# Patient Record
Sex: Male | Born: 1950 | Race: White | Hispanic: No | Marital: Married
Health system: Southern US, Community
[De-identification: ages and names within clinical notes are randomized; demographics above are authoritative.]

## PROBLEM LIST (undated history)

## (undated) DIAGNOSIS — J9811 Atelectasis: Secondary | ICD-10-CM

## (undated) DIAGNOSIS — A419 Sepsis, unspecified organism: Secondary | ICD-10-CM

## (undated) DIAGNOSIS — F1019 Alcohol abuse with unspecified alcohol-induced disorder: Secondary | ICD-10-CM

## (undated) DIAGNOSIS — I482 Chronic atrial fibrillation, unspecified: Secondary | ICD-10-CM

## (undated) DIAGNOSIS — J95811 Postprocedural pneumothorax: Secondary | ICD-10-CM

## (undated) DIAGNOSIS — I5022 Chronic systolic (congestive) heart failure: Secondary | ICD-10-CM

## (undated) DIAGNOSIS — R6520 Severe sepsis without septic shock: Secondary | ICD-10-CM

## (undated) DIAGNOSIS — J9621 Acute and chronic respiratory failure with hypoxia: Secondary | ICD-10-CM

---

## 2018-12-15 ENCOUNTER — Inpatient Hospital Stay
Admission: AD | Admit: 2018-12-15 | Discharge: 2019-01-19 | Disposition: A | Discharge status: Skilled Nursing Facility | Payer: PRIVATE HEALTH INSURANCE | Source: Other Acute Inpatient Hospital | Attending: Internal Medicine | Admitting: Internal Medicine

## 2018-12-15 DIAGNOSIS — Z9889 Other specified postprocedural states: Secondary | ICD-10-CM

## 2018-12-15 DIAGNOSIS — J95811 Postprocedural pneumothorax: Secondary | ICD-10-CM | POA: Diagnosis present

## 2018-12-15 DIAGNOSIS — J930 Spontaneous tension pneumothorax: Secondary | ICD-10-CM

## 2018-12-15 DIAGNOSIS — I2699 Other pulmonary embolism without acute cor pulmonale: Secondary | ICD-10-CM

## 2018-12-15 DIAGNOSIS — K567 Ileus, unspecified: Secondary | ICD-10-CM

## 2018-12-15 DIAGNOSIS — J9690 Respiratory failure, unspecified, unspecified whether with hypoxia or hypercapnia: Secondary | ICD-10-CM

## 2018-12-15 DIAGNOSIS — Z4659 Encounter for fitting and adjustment of other gastrointestinal appliance and device: Secondary | ICD-10-CM

## 2018-12-15 DIAGNOSIS — J939 Pneumothorax, unspecified: Secondary | ICD-10-CM

## 2018-12-15 DIAGNOSIS — Z4682 Encounter for fitting and adjustment of non-vascular catheter: Secondary | ICD-10-CM

## 2018-12-15 DIAGNOSIS — Z9689 Presence of other specified functional implants: Secondary | ICD-10-CM

## 2018-12-15 DIAGNOSIS — J9811 Atelectasis: Secondary | ICD-10-CM | POA: Diagnosis present

## 2018-12-15 DIAGNOSIS — F1019 Alcohol abuse with unspecified alcohol-induced disorder: Secondary | ICD-10-CM | POA: Diagnosis present

## 2018-12-15 DIAGNOSIS — J90 Pleural effusion, not elsewhere classified: Secondary | ICD-10-CM

## 2018-12-15 DIAGNOSIS — J9621 Acute and chronic respiratory failure with hypoxia: Secondary | ICD-10-CM | POA: Diagnosis present

## 2018-12-15 DIAGNOSIS — R6889 Other general symptoms and signs: Secondary | ICD-10-CM

## 2018-12-15 DIAGNOSIS — A419 Sepsis, unspecified organism: Secondary | ICD-10-CM | POA: Diagnosis present

## 2018-12-15 DIAGNOSIS — R6520 Severe sepsis without septic shock: Secondary | ICD-10-CM

## 2018-12-15 DIAGNOSIS — R0902 Hypoxemia: Secondary | ICD-10-CM

## 2018-12-15 DIAGNOSIS — I5022 Chronic systolic (congestive) heart failure: Secondary | ICD-10-CM | POA: Diagnosis present

## 2018-12-15 DIAGNOSIS — I482 Chronic atrial fibrillation, unspecified: Secondary | ICD-10-CM | POA: Diagnosis present

## 2018-12-15 HISTORY — DX: Severe sepsis without septic shock: R65.20

## 2018-12-15 HISTORY — DX: Atelectasis: J98.11

## 2018-12-15 HISTORY — DX: Alcohol abuse with unspecified alcohol-induced disorder: F10.19

## 2018-12-15 HISTORY — DX: Chronic systolic (congestive) heart failure: I50.22

## 2018-12-15 HISTORY — DX: Acute and chronic respiratory failure with hypoxia: J96.21

## 2018-12-15 HISTORY — DX: Sepsis, unspecified organism: A41.9

## 2018-12-15 HISTORY — DX: Chronic atrial fibrillation, unspecified: I48.20

## 2018-12-15 HISTORY — DX: Postprocedural pneumothorax: J95.811

## 2018-12-16 ENCOUNTER — Other Ambulatory Visit (HOSPITAL_COMMUNITY): Payer: Self-pay

## 2018-12-16 LAB — CBC WITH DIFFERENTIAL/PLATELET
Abs Immature Granulocytes: 0.08 10*3/uL — ABNORMAL HIGH (ref 0.00–0.07)
Basophils Absolute: 0 10*3/uL (ref 0.0–0.1)
Basophils Relative: 0 %
Eosinophils Absolute: 0.1 10*3/uL (ref 0.0–0.5)
Eosinophils Relative: 1 %
HCT: 30.3 % — ABNORMAL LOW (ref 39.0–52.0)
Hemoglobin: 9.3 g/dL — ABNORMAL LOW (ref 13.0–17.0)
Immature Granulocytes: 1 %
Lymphocytes Relative: 19 %
Lymphs Abs: 1.8 10*3/uL (ref 0.7–4.0)
MCH: 29.5 pg (ref 26.0–34.0)
MCHC: 30.7 g/dL (ref 30.0–36.0)
MCV: 96.2 fL (ref 80.0–100.0)
Monocytes Absolute: 0.8 10*3/uL (ref 0.1–1.0)
Monocytes Relative: 8 %
Neutro Abs: 6.7 10*3/uL (ref 1.7–7.7)
Neutrophils Relative %: 71 %
Platelets: 268 10*3/uL (ref 150–400)
RBC: 3.15 MIL/uL — ABNORMAL LOW (ref 4.22–5.81)
RDW: 17.2 % — ABNORMAL HIGH (ref 11.5–15.5)
WBC: 9.5 10*3/uL (ref 4.0–10.5)
nRBC: 0 % (ref 0.0–0.2)

## 2018-12-16 LAB — BASIC METABOLIC PANEL
Anion gap: 10 (ref 5–15)
BUN: 15 mg/dL (ref 8–23)
CO2: 25 mmol/L (ref 22–32)
Calcium: 8 mg/dL — ABNORMAL LOW (ref 8.9–10.3)
Chloride: 102 mmol/L (ref 98–111)
Creatinine, Ser: 0.8 mg/dL (ref 0.61–1.24)
GFR calc Af Amer: >60 mL/min (ref >60)
GFR calc non Af Amer: >60 mL/min (ref >60)
Glucose, Bld: 74 mg/dL (ref 70–99)
Potassium: 4.4 mmol/L (ref 3.5–5.1)
Sodium: 137 mmol/L (ref 135–145)

## 2018-12-16 LAB — APTT: aPTT: 146 seconds — ABNORMAL HIGH (ref 24–36)

## 2018-12-16 LAB — PROTIME-INR
INR: 1.24
Prothrombin Time: 15.5 seconds — ABNORMAL HIGH (ref 11.4–15.2)

## 2018-12-16 LAB — HEPARIN LEVEL (UNFRACTIONATED)
Heparin Unfractionated: 0.41 IU/mL (ref 0.30–0.70)
Heparin Unfractionated: 0.66 IU/mL (ref 0.30–0.70)
Heparin Unfractionated: <0.1 IU/mL — ABNORMAL LOW (ref 0.30–0.70)

## 2018-12-17 ENCOUNTER — Encounter: Payer: Self-pay | Admitting: Internal Medicine

## 2018-12-17 DIAGNOSIS — J9811 Atelectasis: Secondary | ICD-10-CM | POA: Diagnosis not present

## 2018-12-17 DIAGNOSIS — I482 Chronic atrial fibrillation, unspecified: Secondary | ICD-10-CM | POA: Diagnosis not present

## 2018-12-17 DIAGNOSIS — J9621 Acute and chronic respiratory failure with hypoxia: Secondary | ICD-10-CM

## 2018-12-17 DIAGNOSIS — R6520 Severe sepsis without septic shock: Secondary | ICD-10-CM

## 2018-12-17 DIAGNOSIS — I5022 Chronic systolic (congestive) heart failure: Secondary | ICD-10-CM | POA: Diagnosis present

## 2018-12-17 DIAGNOSIS — F1019 Alcohol abuse with unspecified alcohol-induced disorder: Secondary | ICD-10-CM | POA: Diagnosis present

## 2018-12-17 DIAGNOSIS — A419 Sepsis, unspecified organism: Secondary | ICD-10-CM

## 2018-12-17 LAB — COMPREHENSIVE METABOLIC PANEL
ALT: 20 U/L (ref 0–44)
AST: 13 U/L — ABNORMAL LOW (ref 15–41)
Albumin: 1.9 g/dL — ABNORMAL LOW (ref 3.5–5.0)
Alkaline Phosphatase: 50 U/L (ref 38–126)
Anion gap: 8 (ref 5–15)
BUN: 14 mg/dL (ref 8–23)
CHLORIDE: 105 mmol/L (ref 98–111)
CO2: 26 mmol/L (ref 22–32)
Calcium: 8 mg/dL — ABNORMAL LOW (ref 8.9–10.3)
Creatinine, Ser: 0.87 mg/dL (ref 0.61–1.24)
GFR calc non Af Amer: >60 mL/min (ref >60)
Glucose, Bld: 79 mg/dL (ref 70–99)
Potassium: 3.6 mmol/L (ref 3.5–5.1)
Sodium: 139 mmol/L (ref 135–145)
Total Bilirubin: 0.9 mg/dL (ref 0.3–1.2)
Total Protein: 4.5 g/dL — ABNORMAL LOW (ref 6.5–8.1)

## 2018-12-17 LAB — CBC
HCT: 32.9 % — ABNORMAL LOW (ref 39.0–52.0)
Hemoglobin: 10.2 g/dL — ABNORMAL LOW (ref 13.0–17.0)
MCH: 30.3 pg (ref 26.0–34.0)
MCHC: 31 g/dL (ref 30.0–36.0)
MCV: 97.6 fL (ref 80.0–100.0)
PLATELETS: 255 10*3/uL (ref 150–400)
RBC: 3.37 MIL/uL — ABNORMAL LOW (ref 4.22–5.81)
RDW: 17.4 % — ABNORMAL HIGH (ref 11.5–15.5)
WBC: 10.2 10*3/uL (ref 4.0–10.5)
nRBC: 0 % (ref 0.0–0.2)

## 2018-12-17 LAB — HEPARIN LEVEL (UNFRACTIONATED)
Heparin Unfractionated: <0.1 IU/mL — ABNORMAL LOW (ref 0.30–0.70)
Heparin Unfractionated: 0.73 IU/mL — ABNORMAL HIGH (ref 0.30–0.70)
Heparin Unfractionated: 0.51 IU/mL (ref 0.30–0.70)
Heparin Unfractionated: 0.45 IU/mL (ref 0.30–0.70)

## 2018-12-17 LAB — PHOSPHORUS: Phosphorus: 3.6 mg/dL (ref 2.5–4.6)

## 2018-12-17 LAB — MAGNESIUM: Magnesium: 1.7 mg/dL (ref 1.7–2.4)

## 2018-12-17 MED ORDER — GENERIC EXTERNAL MEDICATION
25.00 | Status: DC
Start: ? — End: 2018-12-17

## 2018-12-17 MED ORDER — ACETAMINOPHEN 325 MG PO TABS
650.00 | ORAL_TABLET | ORAL | Status: DC
Start: ? — End: 2018-12-17

## 2018-12-17 MED ORDER — OXYCODONE HCL ER 10 MG PO T12A
20.00 | EXTENDED_RELEASE_TABLET | ORAL | Status: DC
Start: 2018-12-15 — End: 2018-12-17

## 2018-12-17 MED ORDER — GENERIC EXTERNAL MEDICATION
15.00 | Status: DC
Start: ? — End: 2018-12-17

## 2018-12-17 MED ORDER — HEPARIN SOD (PORCINE) IN D5W 100 UNIT/ML IV SOLN
11.40 | INTRAVENOUS | Status: DC
Start: ? — End: 2018-12-17

## 2018-12-17 MED ORDER — GENERIC EXTERNAL MEDICATION
Status: DC
Start: 2018-12-15 — End: 2018-12-17

## 2018-12-17 MED ORDER — SODIUM CHLORIDE 0.9 % IV SOLN
10.00 | INTRAVENOUS | Status: DC
Start: ? — End: 2018-12-17

## 2018-12-17 MED ORDER — GENERIC EXTERNAL MEDICATION
10.00 | Status: DC
Start: ? — End: 2018-12-17

## 2018-12-17 MED ORDER — PAROXETINE HCL 20 MG PO TABS
40.00 | ORAL_TABLET | ORAL | Status: DC
Start: 2018-12-16 — End: 2018-12-17

## 2018-12-17 MED ORDER — POTASSIUM CHLORIDE CRYS ER 20 MEQ PO TBCR
20.00 | EXTENDED_RELEASE_TABLET | ORAL | Status: DC
Start: 2018-12-16 — End: 2018-12-17

## 2018-12-17 MED ORDER — GENERIC EXTERNAL MEDICATION
3.00 | Status: DC
Start: ? — End: 2018-12-17

## 2018-12-17 MED ORDER — PANTOPRAZOLE SODIUM 40 MG PO TBEC
40.00 | DELAYED_RELEASE_TABLET | ORAL | Status: DC
Start: 2018-12-15 — End: 2018-12-17

## 2018-12-17 MED ORDER — GENERIC EXTERNAL MEDICATION
4.00 | Status: DC
Start: ? — End: 2018-12-17

## 2018-12-17 MED ORDER — IPRATROPIUM-ALBUTEROL 0.5-2.5 (3) MG/3ML IN SOLN
3.00 | RESPIRATORY_TRACT | Status: DC
Start: 2018-12-15 — End: 2018-12-17

## 2018-12-17 MED ORDER — OXYCODONE HCL 5 MG PO TABS
5.00 | ORAL_TABLET | ORAL | Status: DC
Start: ? — End: 2018-12-17

## 2018-12-17 MED ORDER — MORPHINE SULFATE (PF) 4 MG/ML IV SOLN
2.00 | INTRAVENOUS | Status: DC
Start: ? — End: 2018-12-17

## 2018-12-17 MED ORDER — CALCIUM CARBONATE ANTACID 500 MG PO CHEW
500.00 | CHEWABLE_TABLET | ORAL | Status: DC
Start: 2018-12-16 — End: 2018-12-17

## 2018-12-17 MED ORDER — TRAZODONE HCL 50 MG PO TABS
100.00 | ORAL_TABLET | ORAL | Status: DC
Start: ? — End: 2018-12-17

## 2018-12-17 NOTE — Consult Note (Signed)
[facility]  Date of Service: 12/17/2018  PULMONARY CRITICAL CARE CONSULT   Steven CottaGerard Wong  NFA:[id number]:[id number]  DOB: 10/13/1951   DOA: 12/15/2018  Referring Physician: Carron CurieAli Hijazi, MD  HPI: Steven CottaGerard Wong is a 67 y.o. male seen for follow up of Acute on Chronic Respiratory Failure.  [facility] and weaning.  He has multiple medical problems including reflux chronic atrial fibrillation congestive heart failure history of alcohol abuse GI bleed hypertension presented to the hospital with bouts of GI bleed.  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  Patient had been noted to have melena on a previous admission EGD was done which revealed a non-steroidal induced GJ anastomotic ulcer.  Patient was readmitted in November with an incarcerated incisional hernia at the time he was found to have a necrotic bowel.  Patient underwent reduction of the hernia repair without mesh complicated postoperatively without being able to be extubated.  He developed rapid atrial fibrillation with rapid ventricular response patient was eventually extubated however developed mucous plugging in the left mainstem bronchus requiring a bronchoscopy.  Patient was reintubated for hypoxia.  He ultimately was not able to come off the ventilator and ended up having to have a tracheostomy.  Review of Systems:  ROS performed and is unremarkable other than noted above.  Past Medical History:  Diagnosis Date  . Atrial fibrillation (*)  . CHF (congestive heart failure) (*)  . EtOH dependence (*)  . GI bleed  . HTN (hypertension)   Past Surgical History:  Procedure Laterality Date  . Cholecystectomy  . Gastric bypass  . Hip surgery  . Knee surgery  . Upper gastrointestinal endoscopy 11/24/2018  UGI bleeding; roux-en-Y gastric bypass; Barbaraann ShareBrian S. Smith, MD  [facility]   No Known Allergies    Family History: Non-Contributory to the present illness  Medications: Reviewed on Rounds  Physical Exam:  Vitals: Temperature 98.5 pulse 98 respiratory rate 18 blood pressure 120/70 saturations 99%  Ventilator Settings currently off the ventilator on T collar trials  . General: Comfortable at this time . Eyes: Grossly normal lids, irises & conjunctiva . ENT: grossly tongue is normal . Neck: no obvious mass . Cardiovascular: S1-S2 normal regular rhythm no gallop or rub . Respiratory: No rhonchi no rales are noted at this time . Abdomen: Soft nontender . Skin: no rash seen on limited exam . Musculoskeletal: not rigid . Psychiatric:unable to assess . Neurologic: no seizure no involuntary movements         Labs on Admission:  Basic Metabolic Panel: Recent Labs  Lab 12/16/18 0040 12/17/18 0802  NA 137 139  K 4.4 3.6  CL 102 105  CO2 25 26  GLUCOSE 74 79  BUN 15 14  CREATININE 0.80 0.87  CALCIUM 8.0* 8.0*  MG  --  1.7  PHOS  --  3.6    No results for input(s): PHART, PCO2ART, PO2ART, HCO3, O2SAT in the last 168 hours.  Liver Function Tests: Recent Labs  Lab 12/17/18 0802  AST 13*  ALT 20  ALKPHOS 50  BILITOT 0.9  PROT 4.5*  ALBUMIN 1.9*   No results for input(s): LIPASE, AMYLASE in the last 168 hours. No results for input(s): AMMONIA in the last 168 hours.  CBC: Recent Labs  Lab 12/16/18 0040 12/17/18 0802  WBC 9.5 10.2  NEUTROABS 6.7  --   HGB 9.3*  [phone] 

## 2018-12-18 DIAGNOSIS — J9811 Atelectasis: Secondary | ICD-10-CM | POA: Diagnosis not present

## 2018-12-18 DIAGNOSIS — F1019 Alcohol abuse with unspecified alcohol-induced disorder: Secondary | ICD-10-CM | POA: Diagnosis not present

## 2018-12-18 DIAGNOSIS — I482 Chronic atrial fibrillation, unspecified: Secondary | ICD-10-CM | POA: Diagnosis not present

## 2018-12-18 DIAGNOSIS — J9621 Acute and chronic respiratory failure with hypoxia: Secondary | ICD-10-CM | POA: Diagnosis not present

## 2018-12-18 LAB — CBC WITH DIFFERENTIAL/PLATELET
Abs Immature Granulocytes: 0.08 10*3/uL — ABNORMAL HIGH (ref 0.00–0.07)
Basophils Absolute: 0 10*3/uL (ref 0.0–0.1)
Basophils Relative: 0 %
Eosinophils Absolute: 0.1 10*3/uL (ref 0.0–0.5)
Eosinophils Relative: 1 %
HCT: 33.7 % — ABNORMAL LOW (ref 39.0–52.0)
Hemoglobin: 10.7 g/dL — ABNORMAL LOW (ref 13.0–17.0)
Immature Granulocytes: 1 %
Lymphocytes Relative: 20 %
Lymphs Abs: 2 10*3/uL (ref 0.7–4.0)
MCH: 30.7 pg (ref 26.0–34.0)
MCHC: 31.8 g/dL (ref 30.0–36.0)
MCV: 96.6 fL (ref 80.0–100.0)
Monocytes Absolute: 0.7 10*3/uL (ref 0.1–1.0)
Monocytes Relative: 7 %
Neutro Abs: 7.5 10*3/uL (ref 1.7–7.7)
Neutrophils Relative %: 71 %
Platelets: 294 10*3/uL (ref 150–400)
RBC: 3.49 MIL/uL — ABNORMAL LOW (ref 4.22–5.81)
RDW: 17.3 % — ABNORMAL HIGH (ref 11.5–15.5)
WBC: 10.4 10*3/uL (ref 4.0–10.5)
nRBC: 0 % (ref 0.0–0.2)

## 2018-12-18 LAB — HEPARIN LEVEL (UNFRACTIONATED)
HEPARIN UNFRACTIONATED: 1.24 [IU]/mL — AB (ref 0.30–0.70)
Heparin Unfractionated: 0.63 IU/mL (ref 0.30–0.70)

## 2018-12-18 LAB — MAGNESIUM: Magnesium: 1.9 mg/dL (ref 1.7–2.4)

## 2018-12-18 NOTE — Progress Notes (Signed)
[facility]   PULMONARY CRITICAL CARE SERVICE  PROGRESS NOTE  Date of Service: 12/18/2018  Steven Wong  ZOX:[id number]:[id number]  DOB: 10/14/1951   DOA: 12/15/2018  Referring Physician: Carron CurieAli Hijazi, MD  HPI: Steven CottaGerard Bushway is a 67 y.o. male seen for follow up of Acute on Chronic Respiratory Failure.  Patient currently is on T collar is actually doing very well has been tolerating PMV also  Medications: Reviewed on Rounds  Physical Exam:  Vitals: Temperature 97.0 pulse 102 respiratory 18 blood pressure 118/79 saturations 98%  Ventilator Settings currently off the ventilator on T collar  . General: Comfortable at this time . Eyes: Grossly normal lids, irises & conjunctiva . ENT: grossly tongue is normal . Neck: no obvious mass . Cardiovascular: S1 S2 normal no gallop . Respiratory: No rhonchi no rales are noted at this time . Abdomen: soft . Skin: no rash seen on limited exam . Musculoskeletal: not rigid . Psychiatric:unable to assess . Neurologic: no seizure no involuntary movements         Lab Data:   Basic Metabolic Panel: Recent Labs  Lab 12/16/18 0040 12/17/18 0802 12/18/18 0353  NA 137 139  --   K 4.4 3.6  --   CL 102 105  --   CO2 25 26  --   GLUCOSE 74 79  --   BUN 15 14  --   CREATININE 0.80 0.87  --   CALCIUM 8.0* 8.0*  --   MG  --  1.7 1.9  PHOS  --  3.6  --     ABG: No results for input(s): PHART, PCO2ART, PO2ART, HCO3, O2SAT in the last 168 hours.  Liver Function Tests: Recent Labs  Lab 12/17/18 0802  AST 13*  ALT 20  ALKPHOS 50  BILITOT 0.9  PROT 4.5*  ALBUMIN 1.9*   No results for input(s): LIPASE, AMYLASE in the last 168 hours. No results for input(s): AMMONIA in the last 168 hours.  CBC: Recent Labs  Lab 12/16/18 0040 12/17/18 0802 12/18/18 0353  WBC 9.5 10.2 10.4  NEUTROABS 6.7  --  7.5  HGB 9.3* 10.2* 10.7*  HCT 30.3* 32.9* 33.7*  MCV 96.2 97.6 96.6  PLT 268 255 294     Cardiac Enzymes: No results for input(s): CKTOTAL, CKMB, CKMBINDEX, TROPONINI in the last 168 hours.  BNP (last 3 results) No results for input(s): BNP in the last 8760 hours.  ProBNP (last 3 results) No results for input(s): PROBNP in the last 8760 hours.  Radiological Exams: No results found.  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 Acute on chronic respiratory failure with hypoxia continue with T collar trials titrate oxygen as tolerated.  Continue pulmonary toilet supportive care.  Patient right now is on 28% with the PMV 2. Severe sepsis resolved 3. Chronic atrial fibrillation rate controlled 4. Chronic systolic heart failure at baseline excess fluid atelectasis continue aggressive pulmonary toilet 5. History of alcohol abuse at baseline   I have personally seen and evaluated the patient, evaluated laboratory and imaging results, formulated the assessment and plan and placed orders. The Patient requires high complexity decision making for assessment and support.  Case was discussed on Rounds with the Respiratory Therapy Staff  Allyne Gee, MD [facility] Pulmonary Critical Care Medicine Sleep Medicine

## 2018-12-19 DIAGNOSIS — J9811 Atelectasis: Secondary | ICD-10-CM | POA: Diagnosis not present

## 2018-12-19 DIAGNOSIS — J9621 Acute and chronic respiratory failure with hypoxia: Secondary | ICD-10-CM | POA: Diagnosis not present

## 2018-12-19 DIAGNOSIS — F1019 Alcohol abuse with unspecified alcohol-induced disorder: Secondary | ICD-10-CM | POA: Diagnosis not present

## 2018-12-19 DIAGNOSIS — I482 Chronic atrial fibrillation, unspecified: Secondary | ICD-10-CM | POA: Diagnosis not present

## 2018-12-19 LAB — HEPARIN LEVEL (UNFRACTIONATED): HEPARIN UNFRACTIONATED: 0.74 [IU]/mL — AB (ref 0.30–0.70)

## 2018-12-19 NOTE — Progress Notes (Signed)
[facility]UL[facility]ITICAL [facility]ARE SERVICE  PROGRESS NOTE  Date of Service: 12/19/2018  Steven Wong  ZOX:[id number]:[id number]  DOB: 02/14/1951   DOA: 12/15/2018  Referring Physician: Carron CurieAli Hijazi, MD  HPI: Steven Wong is a 67 y.o. male seen for follow up of Acute on Chronic Respiratory Failure.  Doing very well he is capping now has been doing extremely well with fast-track weaning  Medications: Reviewed on Rounds  Physical Exam:  Vitals: Temperature 97.9 pulse 75 respiratory 14 blood pressure 142/72 saturation is 98%  Ventilator Settings currently off the ventilator begin capping  . General: Comfortable at this time . Eyes: Grossly normal lids, irises & conjunctiva . ENT: grossly tongue is normal . Neck: no obvious mass . Cardiovascular: S1 S2 normal no gallop . Respiratory: Scattered rhonchi expansion is equal . Abdomen: soft . Skin: no rash seen on limited exam . Musculoskeletal: not rigid . Psychiatric:unable to assess . Neurologic: no seizure no involuntary movements         Lab Data:   Basic Metabolic Panel: Recent Labs  Lab 12/16/18 0040 12/17/18 0802 12/18/18 0353  NA 137 139  --   K 4.4 3.6  --   CL 102 105  --   CO2 25 26  --   GLUCOSE 74 79  --   BUN 15 14  --   CREATININE 0.80 0.87  --   CALCIUM 8.0* 8.0*  --   MG  --  1.7 1.9  PHOS  --  3.6  --     ABG: No results for input(s): PHART, PCO2ART, PO2ART, HCO3, O2SAT in the last 168 hours.  Liver Function Tests: Recent Labs  Lab 12/17/18 0802  AST 13*  ALT 20  ALKPHOS 50  BILITOT 0.9  PROT 4.5*  ALBUMIN 1.9*   No results for input(s): LIPASE, AMYLASE in the last 168 hours. No results for input(s): AMMONIA in the last 168 hours.  CBC: Recent Labs  Lab 12/16/18 0040 12/17/18 0802 12/18/18 0353  WBC 9.5 10.2 10.4  NEUTROABS 6.7  --  7.5  HGB 9.3* 10.2* 10.7*  HCT 30.3* 32.9* 33.7*  MCV 96.2 97.6 96.6  PLT 268 255 294     Cardiac Enzymes: No results for input(s): CKTOTAL, CKMB, CKMBINDEX, TROPONINI in the last 168 hours.  BNP (last 3 results) No results for input(s): BNP in the last 8760 hours.  ProBNP (last 3 results) No results for input(s): PROBNP in the last 8760 hours.  Radiological Exams: No results found.  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 Acute on chronic respiratory failure with hypoxia patient is going to continue with advancing the weaning and advance to capping trials. 2. Severe sepsis right now is hemodynamically stable 3. Chronic atrial fibrillation rate controlled 4. Chronic systolic heart failure at baseline 5. Atelectasis continue aggressive pulmonary toilet 6. Alcohol abuse no active withdrawal noted   I have personally seen and evaluated the patient, evaluated laboratory and imaging results, formulated the assessment and plan and placed orders. The Patient requires high complexity decision making for assessment and support.  Case was discussed on Rounds with the Respiratory Therapy Staff  Yevonne PaxSaadat A Bailee Metter, MD [facility] Pulmonary Critical Care Medicine Sleep Medicine

## 2018-12-20 DIAGNOSIS — J9811 Atelectasis: Secondary | ICD-10-CM | POA: Diagnosis not present

## 2018-12-20 DIAGNOSIS — F1019 Alcohol abuse with unspecified alcohol-induced disorder: Secondary | ICD-10-CM | POA: Diagnosis not present

## 2018-12-20 DIAGNOSIS — I482 Chronic atrial fibrillation, unspecified: Secondary | ICD-10-CM | POA: Diagnosis not present

## 2018-12-20 DIAGNOSIS — J9621 Acute and chronic respiratory failure with hypoxia: Secondary | ICD-10-CM | POA: Diagnosis not present

## 2018-12-20 LAB — CBC WITH DIFFERENTIAL/PLATELET
Abs Immature Granulocytes: 0.09 10*3/uL — ABNORMAL HIGH (ref 0.00–0.07)
Basophils Absolute: 0 10*3/uL (ref 0.0–0.1)
Basophils Relative: 0 %
Eosinophils Absolute: 0 10*3/uL (ref 0.0–0.5)
Eosinophils Relative: 0 %
HCT: 34.9 % — ABNORMAL LOW (ref 39.0–52.0)
Hemoglobin: 11.1 g/dL — ABNORMAL LOW (ref 13.0–17.0)
Immature Granulocytes: 1 %
Lymphocytes Relative: 16 %
Lymphs Abs: 1.8 10*3/uL (ref 0.7–4.0)
MCH: 31.1 pg (ref 26.0–34.0)
MCHC: 31.8 g/dL (ref 30.0–36.0)
MCV: 97.8 fL (ref 80.0–100.0)
Monocytes Absolute: 0.8 10*3/uL (ref 0.1–1.0)
Monocytes Relative: 7 %
Neutro Abs: 8.6 10*3/uL — ABNORMAL HIGH (ref 1.7–7.7)
Neutrophils Relative %: 76 %
Platelets: 294 10*3/uL (ref 150–400)
RBC: 3.57 MIL/uL — ABNORMAL LOW (ref 4.22–5.81)
RDW: 17.7 % — ABNORMAL HIGH (ref 11.5–15.5)
WBC: 11.3 10*3/uL — ABNORMAL HIGH (ref 4.0–10.5)
nRBC: 0 % (ref 0.0–0.2)

## 2018-12-20 NOTE — Progress Notes (Signed)
[facility]   PULMONARY CRITICAL CARE SERVICE  PROGRESS NOTE  Date of Service: 12/20/2018  Steven Wong  ZOX:[id number]:[id number]  DOB: 10/24/1951   DOA: 12/15/2018  Referring Physician: Carron CurieAli Hijazi, MD  HPI: Steven Wong is a 67 y.o. male seen for follow up of Acute on Chronic Respiratory Failure.  Currently is not capping doing very well.  Has been on 2 L nasal cannula which he is tolerating it very well  Medications: Reviewed on Rounds  Physical Exam:  Vitals: Temperature 97.5 pulse 77 respiratory 18 blood pressure is 164/88 saturations 96%  Ventilator Settings capping off the ventilator on 2 L  . General: Comfortable at this time . Eyes: Grossly normal lids, irises & conjunctiva . ENT: grossly tongue is normal . Neck: no obvious mass . Cardiovascular: S1 S2 normal no gallop . Respiratory: No rhonchi or rales are noted . Abdomen: soft . Skin: no rash seen on limited exam . Musculoskeletal: not rigid . Psychiatric:unable to assess . Neurologic: no seizure no involuntary movements         Lab Data:   Basic Metabolic Panel: Recent Labs  Lab 12/16/18 0040 12/17/18 0802 12/18/18 0353  NA 137 139  --   K 4.4 3.6  --   CL 102 105  --   CO2 25 26  --   GLUCOSE 74 79  --   BUN 15 14  --   CREATININE 0.80 0.87  --   CALCIUM 8.0* 8.0*  --   MG  --  1.7 1.9  PHOS  --  3.6  --     ABG: No results for input(s): PHART, PCO2ART, PO2ART, HCO3, O2SAT in the last 168 hours.  Liver Function Tests: Recent Labs  Lab 12/17/18 0802  AST 13*  ALT 20  ALKPHOS 50  BILITOT 0.9  PROT 4.5*  ALBUMIN 1.9*   No results for input(s): LIPASE, AMYLASE in the last 168 hours. No results for input(s): AMMONIA in the last 168 hours.  CBC: Recent Labs  Lab 12/16/18 0040 12/17/18 0802 12/18/18 0353 12/20/18 0633  WBC 9.5 10.2 10.4 11.3*  NEUTROABS 6.7  --  7.5 8.6*  HGB 9.3* 10.2* 10.7* 11.1*  HCT 30.3* 32.9* 33.7* 34.9*  MCV  96.2 97.6 96.6 97.8  PLT 268 255 294 294    Cardiac Enzymes: No results for input(s): CKTOTAL, CKMB, CKMBINDEX, TROPONINI in the last 168 hours.  BNP (last 3 results) No results for input(s): BNP in the last 8760 hours.  ProBNP (last 3 results) No results for input(s): PROBNP in the last 8760 hours.  Radiological Exams: No results found.  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 Acute on chronic respiratory failure with hypoxia we will continue advancing the wean on capping continue secretion management pulmonary toilet. 2. Severe sepsis resolved 3. Chronic atrial fibrillation rate is controlled 4. Chronic systolic heart failure at baseline continue with present management 5. Atelectasis of lung continue aggressive pulmonary toilet 6. Alcohol abuse no active withdrawal as noted   I have personally seen and evaluated the patient, evaluated laboratory and imaging results, formulated the assessment and plan and placed orders. The Patient requires high complexity decision making for assessment and support.  Case was discussed on  Rounds with the Respiratory Therapy Staff  Allyne Gee, MD [facility] Pulmonary Critical Care Medicine Sleep Medicine

## 2018-12-21 DIAGNOSIS — J9621 Acute and chronic respiratory failure with hypoxia: Secondary | ICD-10-CM | POA: Diagnosis not present

## 2018-12-21 DIAGNOSIS — I482 Chronic atrial fibrillation, unspecified: Secondary | ICD-10-CM | POA: Diagnosis not present

## 2018-12-21 DIAGNOSIS — J9811 Atelectasis: Secondary | ICD-10-CM | POA: Diagnosis not present

## 2018-12-21 DIAGNOSIS — F1019 Alcohol abuse with unspecified alcohol-induced disorder: Secondary | ICD-10-CM | POA: Diagnosis not present

## 2018-12-21 NOTE — Progress Notes (Signed)
[facility]   PULMONARY CRITICAL CARE SERVICE  PROGRESS NOTE  Date of Service: 12/21/2018  Steven CottaGerard Wong  ZOX:[id number]  DOB: 11/07/1951   DOA: 12/15/2018  Referring Physician: Carron CurieAli Hijazi, MD  HPI: Steven CottaGerard Wong is a 67 y.o. male seen for follow up of Acute on Chronic Respiratory Failure.  Is doing well capping now for more than 48 hours  Medications: Reviewed on Rounds  Physical Exam:  Vitals: Temperature 96.4 pulse 84 respiratory 14 blood pressure 146/75 saturations 93%  Ventilator Settings capping off the ventilator  . General: Comfortable at this time . Eyes: Grossly normal lids, irises & conjunctiva . ENT: grossly tongue is normal . Neck: no obvious mass . Cardiovascular: S1 S2 normal no gallop . Respiratory: No rhonchi no rales . Abdomen: soft . Skin: no rash seen on limited exam . Musculoskeletal: not rigid . Psychiatric:unable to assess . Neurologic: no seizure no involuntary movements         Lab Data:   Basic Metabolic Panel: Recent Labs  Lab 12/16/18 0040 12/17/18 0802 12/18/18 0353  NA 137 139  --   K 4.4 3.6  --   CL 102 105  --   CO2 25 26  --   GLUCOSE 74 79  --   BUN 15 14  --   CREATININE 0.80 0.87  --   CALCIUM 8.0* 8.0*  --   MG  --  1.7 1.9  PHOS  --  3.6  --     ABG: No results for input(s): PHART, PCO2ART, PO2ART, HCO3, O2SAT in the last 168 hours.  Liver Function Tests: Recent Labs  Lab 12/17/18 0802  AST 13*  ALT 20  ALKPHOS 50  BILITOT 0.9  PROT 4.5*  ALBUMIN 1.9*   No results for input(s): LIPASE, AMYLASE in the last 168 hours. No results for input(s): AMMONIA in the last 168 hours.  CBC: Recent Labs  Lab 12/16/18 0040 12/17/18 0802 12/18/18 0353 12/20/18 0633  WBC 9.5 10.2 10.4 11.3*  NEUTROABS 6.7  --  7.5 8.6*  HGB 9.3* 10.2* 10.7* 11.1*  HCT 30.3* 32.9* 33.7* 34.9*  MCV 96.2 97.6 96.6 97.8  PLT 268 255 294 294    Cardiac Enzymes: No results  for input(s): CKTOTAL, CKMB, CKMBINDEX, TROPONINI in the last 168 hours.  BNP (last 3 results) No results for input(s): BNP in the last 8760 hours.  ProBNP (last 3 results) No results for input(s): PROBNP in the last 8760 hours.  Radiological Exams: No results found.  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 Acute on chronic respiratory failure with hypoxia we will continue with capping and hopefully we can decannulate by tomorrow 2. Severe sepsis resolved 3. Chronic atrial fibrillation rate controlled 4. Chronic systolic heart failure at baseline 5. Atelectasis continue aggressive pulmonary toilet 6. Alcohol abuse no active withdrawal   I have personally seen and evaluated the patient, evaluated laboratory and imaging results, formulated the assessment and plan and placed orders. The Patient requires high complexity decision making for assessment and support.  Case was discussed on Rounds with the Respiratory Therapy Staff  Yevonne PaxSaadat A , MD [facility] Pulmonary Critical Care Medicine Sleep Medicine

## 2018-12-22 ENCOUNTER — Other Ambulatory Visit (HOSPITAL_COMMUNITY): Payer: Self-pay

## 2018-12-22 DIAGNOSIS — I482 Chronic atrial fibrillation, unspecified: Secondary | ICD-10-CM | POA: Diagnosis not present

## 2018-12-22 DIAGNOSIS — J9621 Acute and chronic respiratory failure with hypoxia: Secondary | ICD-10-CM | POA: Diagnosis not present

## 2018-12-22 DIAGNOSIS — F1019 Alcohol abuse with unspecified alcohol-induced disorder: Secondary | ICD-10-CM | POA: Diagnosis not present

## 2018-12-22 DIAGNOSIS — J9811 Atelectasis: Secondary | ICD-10-CM | POA: Diagnosis not present

## 2018-12-22 LAB — BASIC METABOLIC PANEL
Anion gap: 10 (ref 5–15)
Anion gap: 7 (ref 5–15)
BUN: 13 mg/dL (ref 8–23)
BUN: 11 mg/dL (ref 8–23)
CO2: 27 mmol/L (ref 22–32)
CO2: 31 mmol/L (ref 22–32)
Calcium: 8 mg/dL — ABNORMAL LOW (ref 8.9–10.3)
Calcium: 7.8 mg/dL — ABNORMAL LOW (ref 8.9–10.3)
Chloride: 101 mmol/L (ref 98–111)
Chloride: 100 mmol/L (ref 98–111)
Creatinine, Ser: 0.69 mg/dL (ref 0.61–1.24)
Creatinine, Ser: 0.78 mg/dL (ref 0.61–1.24)
GFR calc Af Amer: >60 mL/min (ref >60)
GFR calc Af Amer: >60 mL/min (ref >60)
GFR calc non Af Amer: >60 mL/min (ref >60)
GFR calc non Af Amer: >60 mL/min (ref >60)
Glucose, Bld: 79 mg/dL (ref 70–99)
Glucose, Bld: 94 mg/dL (ref 70–99)
Potassium: 4 mmol/L (ref 3.5–5.1)
Potassium: 3.7 mmol/L (ref 3.5–5.1)
Sodium: 138 mmol/L (ref 135–145)
Sodium: 138 mmol/L (ref 135–145)

## 2018-12-22 LAB — BLOOD GAS, ARTERIAL
ACID-BASE EXCESS: 5.8 mmol/L — AB (ref 0.0–2.0)
Acid-Base Excess: 4.8 mmol/L — ABNORMAL HIGH (ref 0.0–2.0)
Bicarbonate: 28.4 mmol/L — ABNORMAL HIGH (ref 20.0–28.0)
Bicarbonate: 29.6 mmol/L — ABNORMAL HIGH (ref 20.0–28.0)
Drawn by: 350431
FIO2: 0.4
O2 Content: 6 L/min
O2 Saturation: 84 %
O2 Saturation: 92 %
Patient temperature: 98.6
Patient temperature: 98.6
pCO2 arterial: 39.2 mmHg (ref 32.0–48.0)
pCO2 arterial: 40.9 mmHg (ref 32.0–48.0)
pH, Arterial: 7.473 — ABNORMAL HIGH (ref 7.350–7.450)
pH, Arterial: 7.473 — ABNORMAL HIGH (ref 7.350–7.450)
pO2, Arterial: 46.5 mmHg — ABNORMAL LOW (ref 83.0–108.0)
pO2, Arterial: 60.7 mmHg — ABNORMAL LOW (ref 83.0–108.0)

## 2018-12-22 LAB — CBC
HCT: 33 % — ABNORMAL LOW (ref 39.0–52.0)
HCT: 31 % — ABNORMAL LOW (ref 39.0–52.0)
Hemoglobin: 10.3 g/dL — ABNORMAL LOW (ref 13.0–17.0)
Hemoglobin: 9.9 g/dL — ABNORMAL LOW (ref 13.0–17.0)
MCH: 30.4 pg (ref 26.0–34.0)
MCH: 30.2 pg (ref 26.0–34.0)
MCHC: 31.2 g/dL (ref 30.0–36.0)
MCHC: 31.9 g/dL (ref 30.0–36.0)
MCV: 97.3 fL (ref 80.0–100.0)
MCV: 94.5 fL (ref 80.0–100.0)
NRBC: 0 % (ref 0.0–0.2)
Platelets: 297 10*3/uL (ref 150–400)
Platelets: 296 10*3/uL (ref 150–400)
RBC: 3.39 MIL/uL — ABNORMAL LOW (ref 4.22–5.81)
RBC: 3.28 MIL/uL — ABNORMAL LOW (ref 4.22–5.81)
RDW: 17.5 % — ABNORMAL HIGH (ref 11.5–15.5)
RDW: 17.3 % — AB (ref 11.5–15.5)
WBC: 10.6 10*3/uL — ABNORMAL HIGH (ref 4.0–10.5)
WBC: 13.3 10*3/uL — ABNORMAL HIGH (ref 4.0–10.5)
nRBC: 0 % (ref 0.0–0.2)

## 2018-12-22 LAB — PHOSPHORUS: Phosphorus: 4.2 mg/dL (ref 2.5–4.6)

## 2018-12-22 LAB — LACTIC ACID, PLASMA: Lactic Acid, Venous: 0.8 mmol/L (ref 0.5–1.9)

## 2018-12-22 LAB — MAGNESIUM: Magnesium: 1.4 mg/dL — ABNORMAL LOW (ref 1.7–2.4)

## 2018-12-22 MED ORDER — IOPAMIDOL (ISOVUE-370) INJECTION 76%
100.0000 mL | Freq: Once | INTRAVENOUS | Status: AC | PRN
  Administered 2018-12-22: 100 mL via INTRAVENOUS

## 2018-12-22 MED ORDER — IOPAMIDOL (ISOVUE-370) INJECTION 76%
INTRAVENOUS | Status: AC
  Filled 2018-12-22: qty 100

## 2018-12-22 NOTE — Progress Notes (Signed)
[facility]   PULMONARY CRITICAL CARE SERVICE  PROGRESS NOTE  Date of Service: 12/22/2018  Steven CottaGerard Wong  ZOX:[id number]  DOB: 01/15/1951   DOA: 12/15/2018  Referring Physician: Carron CurieAli Hijazi, MD  HPI: Steven Wong is a 67 y.o. male seen for follow up of Acute on Chronic Respiratory Failure.  Patient is capping looks good he is wanting to be decannulated and he should be able to be decannulated today  Medications: Reviewed on Rounds  Physical Exam:  Vitals: Temperature 97.4 pulse 82 respiratory 16 blood pressure 110/69 saturations 100%  Ventilator Settings capping off the ventilator  . General: Comfortable at this time . Eyes: Grossly normal lids, irises & conjunctiva . ENT: grossly tongue is normal . Neck: no obvious mass . Cardiovascular: S1 S2 normal no gallop . Respiratory: No rhonchi or rales are noted . Abdomen: soft . Skin: no rash seen on limited exam . Musculoskeletal: not rigid . Psychiatric:unable to assess . Neurologic: no seizure no involuntary movements         Lab Data:   Basic Metabolic Panel: Recent Labs  Lab 12/16/18 0040 12/17/18 0802 12/18/18 0353 12/22/18 0541  NA 137 139  --  138  K 4.4 3.6  --  4.0  CL 102 105  --  101  CO2 25 26  --  27  GLUCOSE 74 79  --  79  BUN 15 14  --  13  CREATININE 0.80 0.87  --  0.69  CALCIUM 8.0* 8.0*  --  8.0*  MG  --  1.7 1.9 1.4*  PHOS  --  3.6  --  4.2    ABG: No results for input(s): PHART, PCO2ART, PO2ART, HCO3, O2SAT in the last 168 hours.  Liver Function Tests: Recent Labs  Lab 12/17/18 0802  AST 13*  ALT 20  ALKPHOS 50  BILITOT 0.9  PROT 4.5*  ALBUMIN 1.9*   No results for input(s): LIPASE, AMYLASE in the last 168 hours. No results for input(s): AMMONIA in the last 168 hours.  CBC: Recent Labs  Lab 12/16/18 0040 12/17/18 0802 12/18/18 0353 12/20/18 0633 12/22/18 0541  WBC 9.5 10.2 10.4 11.3* 10.6*  NEUTROABS 6.7  --  7.5  8.6*  --   HGB 9.3* 10.2* 10.7* 11.1* 10.3*  HCT 30.3* 32.9* 33.7* 34.9* 33.0*  MCV 96.2 97.6 96.6 97.8 97.3  PLT 268 255 294 294 297    Cardiac Enzymes: No results for input(s): CKTOTAL, CKMB, CKMBINDEX, TROPONINI in the last 168 hours.  BNP (last 3 results) No results for input(s): BNP in the last 8760 hours.  ProBNP (last 3 results) No results for input(s): PROBNP in the last 8760 hours.  Radiological Exams: No results found.  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 Acute on chronic respiratory failure with hypoxia we will continue to advance his wean and proceed to decannulation. 2. Severe sepsis hemodynamically stable we will continue to monitor 3. Chronic atrial fibrillation rate is controlled 4. Chronic systolic heart failure at baseline we will continue to monitor 5. Atelectasis aggressive pulmonary toilet 6. Alcohol abuse no active withdrawal as noted   I have personally seen and evaluated the patient, evaluated laboratory and imaging results, formulated the assessment and plan and  placed orders. The Patient requires high complexity decision making for assessment and support.  Case was discussed on Rounds with the Respiratory Therapy Staff  Allyne Gee, MD [facility] Pulmonary Critical Care Medicine Sleep Medicine

## 2018-12-23 ENCOUNTER — Other Ambulatory Visit (HOSPITAL_COMMUNITY): Payer: Self-pay

## 2018-12-23 DIAGNOSIS — J9621 Acute and chronic respiratory failure with hypoxia: Secondary | ICD-10-CM | POA: Diagnosis not present

## 2018-12-23 DIAGNOSIS — F1019 Alcohol abuse with unspecified alcohol-induced disorder: Secondary | ICD-10-CM | POA: Diagnosis not present

## 2018-12-23 DIAGNOSIS — I482 Chronic atrial fibrillation, unspecified: Secondary | ICD-10-CM | POA: Diagnosis not present

## 2018-12-23 DIAGNOSIS — J90 Pleural effusion, not elsewhere classified: Secondary | ICD-10-CM

## 2018-12-23 DIAGNOSIS — R6889 Other general symptoms and signs: Secondary | ICD-10-CM

## 2018-12-23 DIAGNOSIS — J9811 Atelectasis: Secondary | ICD-10-CM | POA: Diagnosis not present

## 2018-12-23 LAB — PROTEIN, TOTAL: TOTAL PROTEIN: 5.8 g/dL — AB (ref 6.5–8.1)

## 2018-12-23 LAB — PROTEIN, PLEURAL OR PERITONEAL FLUID: Total protein, fluid: <3 g/dL

## 2018-12-23 LAB — GRAM STAIN

## 2018-12-23 LAB — BODY FLUID CELL COUNT WITH DIFFERENTIAL
Lymphs, Fluid: 10 %
MONOCYTE-MACROPHAGE-SEROUS FLUID: 48 % — AB (ref 50–90)
Neutrophil Count, Fluid: 42 % — ABNORMAL HIGH (ref 0–25)
Total Nucleated Cell Count, Fluid: 69 cu mm (ref 0–1000)

## 2018-12-23 LAB — GLUCOSE, PLEURAL OR PERITONEAL FLUID: GLUCOSE FL: 98 mg/dL

## 2018-12-23 MED ORDER — LIDOCAINE HCL (PF) 1 % IJ SOLN
INTRAMUSCULAR | Status: AC
  Filled 2018-12-23: qty 30

## 2018-12-23 NOTE — Procedures (Signed)
PROCEDURE SUMMARY:  Successful US guided left thoracentesis. Yielded 930 ml of clear yellow fluid. Pt tolerated procedure well. No immediate complications.  Specimen was sent for labs. CXR ordered.  EBL < 5 mL  Brayton ElKevin Anae Hams PA-C 12/23/2018 1:03 PM

## 2018-12-23 NOTE — Progress Notes (Signed)
P[facility] Critical [facility]   PULMONARY CRITICAL CARE SERVICE  PROGRESS NOTE  Date of Service: 12/23/2018  Steven Wong  WUJ:[id number]  DOB: 02/24/1951   DOA: 12/15/2018  Referring Physician: Carron CurieAli Hijazi, MD  HPI: Steven Wong is a 67 y.o. male seen for follow up of Acute on Chronic Respiratory Failure.  Patient had significant decompensation he was supposed to be decannulated however he had drop in his saturations he had to be placed back on higher FiO2 and then eventually placed back on the ventilator.  Right now he is back off on T collar on 50% FiO2.  It suspected that he may have aspirated  Medications: Reviewed on Rounds  Physical Exam:  Vitals: Temperature 97.0 pulse 87 respiratory rate 26 blood pressure 131/55 saturations 94%  Ventilator Settings off the ventilator on T collar currently on 50% FiO2  . General: Comfortable at this time . Eyes: Grossly normal lids, irises & conjunctiva . ENT: grossly tongue is normal . Neck: no obvious mass . Cardiovascular: S1 S2 normal no gallop . Respiratory: Coarse breath sounds noted bilaterally . Abdomen: soft . Skin: no rash seen on limited exam . Musculoskeletal: not rigid . Psychiatric:unable to assess . Neurologic: no seizure no involuntary movements         Lab Data:   Basic Metabolic Panel: Recent Labs  Lab 12/17/18 0802 12/18/18 0353 12/22/18 0541 12/22/18 2233  NA 139  --  138 138  K 3.6  --  4.0 3.7  CL 105  --  101 100  CO2 26  --  27 31  GLUCOSE 79  --  79 94  BUN 14  --  13 11  CREATININE 0.87  --  0.69 0.78  CALCIUM 8.0*  --  8.0* 7.8*  MG 1.7 1.9 1.4*  --   PHOS 3.6  --  4.2  --     ABG: Recent Labs  Lab 12/22/18 1452 12/22/18 2101  PHART 7.473* 7.473*  PCO2ART 39.2 40.9  PO2ART 46.5* 60.7*  HCO3 28.4* 29.6*  O2SAT 84.0 92.0    Liver Function Tests: Recent Labs  Lab 12/17/18 0802 12/23/18 1302  AST 13*  --   ALT 20  --   ALKPHOS 50  --    BILITOT 0.9  --   PROT 4.5* 5.8*  ALBUMIN 1.9*  --    No results for input(s): LIPASE, AMYLASE in the last 168 hours. No results for input(s): AMMONIA in the last 168 hours.  CBC: Recent Labs  Lab 12/17/18 0802 12/18/18 0353 12/20/18 [id number] 12/22/18 0541 12/22/18 2233  WBC 10.2 10.4 11.3* 10.6* 13.3*  NEUTROABS  --  7.5 8.6*  --   --   HGB 10.2* 10.7* 11.1* 10.3* 9.9*  HCT 32.9* 33.7* 34.9* 33.0* 31.0*  MCV 97.6 96.6 97.8 97.3 94.5  PLT 255 294 294 297 296    Cardiac Enzymes: No results for input(s): CKTOTAL, CKMB, CKMBINDEX, TROPONINI in the last 168 hours.  BNP (last 3 results) No results for input(s): BNP in the last 8760 hours.  ProBNP (last 3 results) No results for input(s): PROBNP in the last 8760 hours.  Radiological Exams: Ct Angio Chest Pe W Or Wo Contrast  Result Date: 12/22/2018 CLINICAL DATA:  Pulmonary edema. EXAM: CT ANGIOGRAPHY CHEST WITH CONTRAST TECHNIQUE: Multidetector CT imaging of the chest was performed using the standard protocol during bolus administration of intravenous contrast. Multiplanar CT image reconstructions and MIPs were obtained to evaluate the vascular anatomy. CONTRAST:  65 ml ISOVUE-370 IOPAMIDOL (ISOVUE-370) INJECTION 76% COMPARISON:  Chest x-ray on 12/22/2018 FINDINGS: Cardiovascular: The heart is enlarged. There is trace pericardial effusion. There is dense atherosclerotic calcification of coronary arteries. Dense atherosclerotic calcification of the thoracic aorta not associated with aneurysm. The pulmonary artery is markedly enlarged, measuring 4.8 centimeters. The RV: LV ratio is 1.0 but this may be affected by the longstanding pulmonary arterial hypertension and overall cardiomegaly. There are numerous emboli within the pulmonary arteries. Within a LEFT LOWER lobe branch, small embolus is identified on image 113/7. subtle emboli are identified within RIGHT LOWER lobe branches on image 143/7. RIGHT middle lobe embolus on image 151/7.  Mediastinum/Nodes: Tracheostomy tube in place above the carina. The esophagus contains air but is otherwise unremarkable. Lungs/Pleura: Bilateral pleural effusions and bilateral LOWER lobe consolidations combined with atelectasis. Infarcts could have a similar appearance and are difficult to exclude. Within the aerated lung in the lung apices, there are patchy areas of ground-glass opacity, favoring infectious process. Upper Abdomen: No acute abnormality. Musculoskeletal: Degenerative changes are seen in the thoracic spine. No suspicious lytic or blastic lesions are identified. Review of the MIP images confirms the above findings. IMPRESSION: 1. Bilateral pulmonary emboli. 2. The RV: LV: Ratio is elevated (1.0). However, the validity of this measurement is not known, given the presence of pulmonary arterial hypertension and marked enlargement of the pulmonary artery. 3. Bilateral pleural effusions. Bilateral LOWER lobe consolidations and/or infarcts. Airspace filling opacity in the remaining aerated portion of the lungs favors infectious process over pulmonary edema. 4. Tracheostomy tube in normal position. Critical Value/emergent results were called by telephone at the time of interpretation on 12/22/2018 at 9:33 pm to Dr. Bess Harvest, who verbally acknowledged these results. Electronically Signed   By: Norva Pavlov M.D.   On: 12/22/2018 21:34   Dg Chest Port 1 View  Result Date: 12/23/2018 CLINICAL DATA:  Post left thoracentesis. EXAM: PORTABLE CHEST 1 VIEW COMPARISON:  12/22/2018 FINDINGS: Improved aeration in the left lung. Residual densities at the left lung base. Negative for a left pneumothorax. There continues to be volume loss in the right lung with blunting at the right costophrenic angle and evidence for right pleural effusion. Cardiac silhouette is grossly stable. Atherosclerotic calcifications at the aortic arch. Left arm PICC line tip is near the upper SVC and may be pointing towards the azygos vein.  Patient has a tracheostomy tube. IMPRESSION: 1. Improved aeration at the left lung base following thoracentesis. Negative for pneumothorax. 2. Right pleural effusion. 3. PICC line tip in the upper SVC as described. Electronically Signed   By: Richarda Overlie M.D.   On: 12/23/2018 13:46   Dg Chest Port 1 View  Result Date: 12/22/2018 CLINICAL DATA:  Increased oxygen demand EXAM: PORTABLE CHEST 1 VIEW COMPARISON:  12/16/2018 FINDINGS: Stable left upper extremity PICC. Lungs are very under aerated. Bilateral pleural effusions are not significantly changed. There is central consolidation both lungs. No pneumothorax. Stable tracheostomy tube. IMPRESSION: Bilateral pleural effusions, low lung volumes, and central consolidation are stable. Electronically Signed   By: Jolaine Click M.D.   On: 12/22/2018 16:24   US Thoracentesis Asp Pleural Space W/img Guide  Result Date: 12/23/2018 INDICATION: Shortness of breath. Left-sided pleural effusion. Request for diagnostic and therapeutic thoracentesis. EXAM: ULTRASOUND GUIDED LEFT THORACENTESIS MEDICATIONS: None. COMPLICATIONS: None immediate. PROCEDURE: An ultrasound guided thoracentesis was thoroughly discussed with the patient and questions answered. The benefits, risks, alternatives and complications were also discussed. The patient understands and wishes to proceed with  the procedure. Written consent was obtained. Ultrasound was performed to localize and mark an adequate pocket of fluid in the left chest. The area was then prepped and draped in the normal sterile fashion. 1% Lidocaine was used for local anesthesia. Under ultrasound guidance a 6 Fr Safe-T-Centesis catheter was introduced. Thoracentesis was performed. The catheter was removed and a dressing applied. FINDINGS: A total of approximately 930 mL of clear pale yellow fluid was removed. Samples were sent to the laboratory as requested by the clinical team. IMPRESSION: Successful ultrasound guided left  thoracentesis yielding 930 mL of pleural fluid. Read by: Brayton ElKevin Bruning PA-C Electronically Signed   By: Richarda OverlieAdam  Henn M.D.   On: 12/23/2018 13:26    Assessment/Plan Active Problems:   Acute on chronic respiratory failure with hypoxia (HCC)   Severe sepsis (HCC)   Chronic atrial fibrillation   Chronic systolic heart failure (HCC)   Atelectasis of left lung   Alcohol abuse with unspecified alcohol-induced disorder (HCC)   1. Acute on chronic respiratory failure with hypoxia patient had significant decompensation yesterday.  He did have a thoracentesis done which hopefully will help with reexpansion of the lung but also there is concern of some aspiration.  Today we are going to see how he does try to titrate oxygen down and will see if he can now try to Once again. 2. Severe sepsis this is resolved hemodynamically stable 3. Chronic atrial fibrillation rate is controlled 4. Chronic systolic heart failure at baseline we will continue to monitor his fluid status 5. Atelectasis of the lung likely secondary to the pleural effusion which has been tapped approximately 930 cc was removed 6. Alcohol abuse history right now is compensated we will continue to monitor   I have personally seen and evaluated the patient, evaluated laboratory and imaging results, formulated the assessment and plan and placed orders.  Time 35 minutes with acute change in condition discussed with the staff and treatment team The Patient requires high complexity decision making for assessment and support.  Case was discussed on Rounds with the Respiratory Therapy Staff  Yevonne PaxSaadat A Essam Lowdermilk, MD [facility] Pulmonary Critical Care Medicine Sleep Medicine

## 2018-12-24 ENCOUNTER — Other Ambulatory Visit (HOSPITAL_COMMUNITY): Payer: Self-pay

## 2018-12-24 DIAGNOSIS — J9811 Atelectasis: Secondary | ICD-10-CM | POA: Diagnosis not present

## 2018-12-24 DIAGNOSIS — F1019 Alcohol abuse with unspecified alcohol-induced disorder: Secondary | ICD-10-CM | POA: Diagnosis not present

## 2018-12-24 DIAGNOSIS — I482 Chronic atrial fibrillation, unspecified: Secondary | ICD-10-CM | POA: Diagnosis not present

## 2018-12-24 DIAGNOSIS — J9621 Acute and chronic respiratory failure with hypoxia: Secondary | ICD-10-CM | POA: Diagnosis not present

## 2018-12-24 LAB — BASIC METABOLIC PANEL
Anion gap: 8 (ref 5–15)
BUN: 8 mg/dL (ref 8–23)
CO2: 32 mmol/L (ref 22–32)
Calcium: 7.9 mg/dL — ABNORMAL LOW (ref 8.9–10.3)
Chloride: 99 mmol/L (ref 98–111)
Creatinine, Ser: 0.67 mg/dL (ref 0.61–1.24)
GFR calc Af Amer: >60 mL/min (ref >60)
GFR calc non Af Amer: >60 mL/min (ref >60)
Glucose, Bld: 73 mg/dL (ref 70–99)
Potassium: 3.4 mmol/L — ABNORMAL LOW (ref 3.5–5.1)
Sodium: 139 mmol/L (ref 135–145)

## 2018-12-24 LAB — CBC
HCT: 32.8 % — ABNORMAL LOW (ref 39.0–52.0)
HEMOGLOBIN: 10.5 g/dL — AB (ref 13.0–17.0)
MCH: 31 pg (ref 26.0–34.0)
MCHC: 32 g/dL (ref 30.0–36.0)
MCV: 96.8 fL (ref 80.0–100.0)
Platelets: 335 10*3/uL (ref 150–400)
RBC: 3.39 MIL/uL — ABNORMAL LOW (ref 4.22–5.81)
RDW: 17.4 % — ABNORMAL HIGH (ref 11.5–15.5)
WBC: 11 10*3/uL — ABNORMAL HIGH (ref 4.0–10.5)
nRBC: 0 % (ref 0.0–0.2)

## 2018-12-24 LAB — MAGNESIUM: MAGNESIUM: 1.4 mg/dL — AB (ref 1.7–2.4)

## 2018-12-24 LAB — PROTIME-INR
INR: 1.35
Prothrombin Time: 16.5 seconds — ABNORMAL HIGH (ref 11.4–15.2)

## 2018-12-24 LAB — PHOSPHORUS: PHOSPHORUS: 3.6 mg/dL (ref 2.5–4.6)

## 2018-12-24 NOTE — Progress Notes (Signed)
[facility]   PULMONARY CRITICAL CARE SERVICE  PROGRESS NOTE  Date of Service: 12/24/2018  Steven Wong  [id number]  DOB: 07/02/1951   DOA: 12/15/2018  Referring Physician: Carron CurieAli Hijazi, MD  HPI: Steven Wong is a 67 y.o. male seen for follow up of Acute on Chronic Respiratory Failure.  Patient is on T collar right now has been on 35% FiO2 good saturations are noted  Medications: Reviewed on Rounds  Physical Exam:  Vitals: Temperature 97.0 pulse 85 respiratory rate 16 blood pressure 130/74 saturations 98%  Ventilator Settings off the ventilator on T collar right now  . General: Comfortable at this time . Eyes: Grossly normal lids, irises & conjunctiva . ENT: grossly tongue is normal . Neck: no obvious mass . Cardiovascular: S1 S2 normal no gallop . Respiratory: No rhonchi no rales are noted . Abdomen: soft . Skin: no rash seen on limited exam . Musculoskeletal: not rigid . Psychiatric:unable to assess . Neurologic: no seizure no involuntary movements         Lab Data:   Basic Metabolic Panel: Recent Labs  Lab 12/18/18 0353 12/22/18 0541 12/22/18 2233 12/24/18 0624  NA  --  138 138 139  K  --  4.0 3.7 3.4*  CL  --  101 100 99  CO2  --  27 31 32  GLUCOSE  --  79 94 73  BUN  --  13 11 8   CREATININE  --  0.69 0.78 0.67  CALCIUM  --  8.0* 7.8* 7.9*  MG 1.9 1.4*  --  1.4*  PHOS  --  4.2  --  3.6    ABG: Recent Labs  Lab 12/22/18 1452 12/22/18 2101  PHART 7.473* 7.473*  PCO2ART 39.2 40.9  PO2ART 46.5* 60.7*  HCO3 28.4* 29.6*  O2SAT 84.0 92.0    Liver Function Tests: Recent Labs  Lab 12/23/18 1302  PROT 5.8*   No results for input(s): LIPASE, AMYLASE in the last 168 hours. No results for input(s): AMMONIA in the last 168 hours.  CBC: Recent Labs  Lab 12/18/18 0353 12/20/18 [id number] 12/22/18 0541 12/22/18 2233 12/24/18 0624  WBC 10.4 11.3* 10.6* 13.3* 11.0*  NEUTROABS 7.5 8.6*  --    --   --   HGB 10.7* 11.1* 10.3* 9.9* 10.5*  HCT 33.7* 34.9* 33.0* 31.0* 32.8*  MCV 96.6 97.8 97.3 94.5 96.8  PLT 294 294 297 296 335    Cardiac Enzymes: No results for input(s): CKTOTAL, CKMB, CKMBINDEX, TROPONINI in the last 168 hours.  BNP (last 3 results) No results for input(s): BNP in the last 8760 hours.  ProBNP (last 3 results) No results for input(s): PROBNP in the last 8760 hours.  Radiological Exams: Ct Angio Chest Pe W Or Wo Contrast  Result Date: 12/22/2018 CLINICAL DATA:  Pulmonary edema. EXAM: CT ANGIOGRAPHY CHEST WITH CONTRAST TECHNIQUE: Multidetector CT imaging of the chest was performed using the standard protocol during bolus administration of intravenous contrast. Multiplanar CT image reconstructions and MIPs were obtained to evaluate the vascular anatomy. CONTRAST:  65 ml ISOVUE-370 IOPAMIDOL (ISOVUE-370) INJECTION 76% COMPARISON:  Chest x-ray on 12/22/2018 FINDINGS: Cardiovascular: The heart is enlarged. There is trace pericardial effusion. There is dense atherosclerotic calcification of coronary arteries. Dense atherosclerotic calcification of the thoracic aorta not associated with aneurysm. The pulmonary artery is markedly enlarged, measuring 4.8 centimeters. The RV: LV ratio is 1.0 but this may be affected by the longstanding pulmonary arterial hypertension and overall cardiomegaly.  There are numerous emboli within the pulmonary arteries. Within a LEFT LOWER lobe branch, small embolus is identified on image 113/7. subtle emboli are identified within RIGHT LOWER lobe branches on image 143/7. RIGHT middle lobe embolus on image 151/7. Mediastinum/Nodes: Tracheostomy tube in place above the carina. The esophagus contains air but is otherwise unremarkable. Lungs/Pleura: Bilateral pleural effusions and bilateral LOWER lobe consolidations combined with atelectasis. Infarcts could have a similar appearance and are difficult to exclude. Within the aerated lung in the lung apices,  there are patchy areas of ground-glass opacity, favoring infectious process. Upper Abdomen: No acute abnormality. Musculoskeletal: Degenerative changes are seen in the thoracic spine. No suspicious lytic or blastic lesions are identified. Review of the MIP images confirms the above findings. IMPRESSION: 1. Bilateral pulmonary emboli. 2. The RV: LV: Ratio is elevated (1.0). However, the validity of this measurement is not known, given the presence of pulmonary arterial hypertension and marked enlargement of the pulmonary artery. 3. Bilateral pleural effusions. Bilateral LOWER lobe consolidations and/or infarcts. Airspace filling opacity in the remaining aerated portion of the lungs favors infectious process over pulmonary edema. 4. Tracheostomy tube in normal position. Critical Value/emergent results were called by telephone at the time of interpretation on 12/22/2018 at 9:33 pm to Dr. Bess Harvest, who verbally acknowledged these results. Electronically Signed   By: Norva Pavlov M.D.   On: 12/22/2018 21:34   Dg Chest Port 1 View  Result Date: 12/23/2018 CLINICAL DATA:  Post left thoracentesis. EXAM: PORTABLE CHEST 1 VIEW COMPARISON:  12/22/2018 FINDINGS: Improved aeration in the left lung. Residual densities at the left lung base. Negative for a left pneumothorax. There continues to be volume loss in the right lung with blunting at the right costophrenic angle and evidence for right pleural effusion. Cardiac silhouette is grossly stable. Atherosclerotic calcifications at the aortic arch. Left arm PICC line tip is near the upper SVC and may be pointing towards the azygos vein. Patient has a tracheostomy tube. IMPRESSION: 1. Improved aeration at the left lung base following thoracentesis. Negative for pneumothorax. 2. Right pleural effusion. 3. PICC line tip in the upper SVC as described. Electronically Signed   By: Richarda Overlie M.D.   On: 12/23/2018 13:46   Dg Chest Port 1 View  Result Date: 12/22/2018 CLINICAL  DATA:  Increased oxygen demand EXAM: PORTABLE CHEST 1 VIEW COMPARISON:  12/16/2018 FINDINGS: Stable left upper extremity PICC. Lungs are very under aerated. Bilateral pleural effusions are not significantly changed. There is central consolidation both lungs. No pneumothorax. Stable tracheostomy tube. IMPRESSION: Bilateral pleural effusions, low lung volumes, and central consolidation are stable. Electronically Signed   By: Jolaine Click M.D.   On: 12/22/2018 16:24   US Thoracentesis Asp Pleural Space W/img Guide  Result Date: 12/23/2018 INDICATION: Shortness of breath. Left-sided pleural effusion. Request for diagnostic and therapeutic thoracentesis. EXAM: ULTRASOUND GUIDED LEFT THORACENTESIS MEDICATIONS: None. COMPLICATIONS: None immediate. PROCEDURE: An ultrasound guided thoracentesis was thoroughly discussed with the patient and questions answered. The benefits, risks, alternatives and complications were also discussed. The patient understands and wishes to proceed with the procedure. Written consent was obtained. Ultrasound was performed to localize and mark an adequate pocket of fluid in the left chest. The area was then prepped and draped in the normal sterile fashion. 1% Lidocaine was used for local anesthesia. Under ultrasound guidance a 6 Fr Safe-T-Centesis catheter was introduced. Thoracentesis was performed. The catheter was removed and a dressing applied. FINDINGS: A total of approximately 930 mL of clear pale  yellow fluid was removed. Samples were sent to the laboratory as requested by the clinical team. IMPRESSION: Successful ultrasound guided left thoracentesis yielding 930 mL of pleural fluid. Read by: Kevin Bruning PA-C Electronically Signed   By: RicharBrayton Elda OverlieAdam  Henn M.D.   On: 12/23/2018 13:26    Assessment/Plan Active Problems:   Acute on chronic respiratory failure with hypoxia (HCC)   Severe sepsis (HCC)   Chronic atrial fibrillation   Chronic systolic heart failure (HCC)   Atelectasis of  left lung   Alcohol abuse with unspecified alcohol-induced disorder (HCC)   1. Acute on chronic respiratory failure with hypoxia patient is doing better oxygen has been weaned down to 35% I will get a repeat chest x-ray.  If patient still has significant changes noted then would consider bronchoscopy for airway evaluation 2. Severe sepsis hemodynamically stable 3. Chronic atrial fibrillation rate controlled 4. Chronic systolic heart failure monitor fluid status 5. Atelectasis status post thoracentesis 6. Alcohol abuse no active withdrawal noted   I have personally seen and evaluated the patient, evaluated laboratory and imaging results, formulated the assessment and plan and placed orders. The Patient requires high complexity decision making for assessment and support.  Case was discussed on Rounds with the Respiratory Therapy Staff  Yevonne PaxSaadat A Evan Osburn, MD [facility] Pulmonary Critical Care Medicine Sleep Medicine

## 2018-12-25 ENCOUNTER — Other Ambulatory Visit (HOSPITAL_COMMUNITY): Payer: Self-pay

## 2018-12-25 DIAGNOSIS — I482 Chronic atrial fibrillation, unspecified: Secondary | ICD-10-CM | POA: Diagnosis not present

## 2018-12-25 DIAGNOSIS — F1019 Alcohol abuse with unspecified alcohol-induced disorder: Secondary | ICD-10-CM | POA: Diagnosis not present

## 2018-12-25 DIAGNOSIS — J9811 Atelectasis: Secondary | ICD-10-CM | POA: Diagnosis not present

## 2018-12-25 DIAGNOSIS — J9621 Acute and chronic respiratory failure with hypoxia: Secondary | ICD-10-CM | POA: Diagnosis not present

## 2018-12-25 LAB — CBC
HCT: 29.2 % — ABNORMAL LOW (ref 39.0–52.0)
Hemoglobin: 9.4 g/dL — ABNORMAL LOW (ref 13.0–17.0)
MCH: 31.1 pg (ref 26.0–34.0)
MCHC: 32.2 g/dL (ref 30.0–36.0)
MCV: 96.7 fL (ref 80.0–100.0)
Platelets: 293 10*3/uL (ref 150–400)
RBC: 3.02 MIL/uL — ABNORMAL LOW (ref 4.22–5.81)
RDW: 17.4 % — AB (ref 11.5–15.5)
WBC: 12.7 10*3/uL — AB (ref 4.0–10.5)
nRBC: 0 % (ref 0.0–0.2)

## 2018-12-25 LAB — BASIC METABOLIC PANEL
Anion gap: 8 (ref 5–15)
BUN: 5 mg/dL — ABNORMAL LOW (ref 8–23)
CO2: 29 mmol/L (ref 22–32)
Calcium: 7.6 mg/dL — ABNORMAL LOW (ref 8.9–10.3)
Chloride: 102 mmol/L (ref 98–111)
Creatinine, Ser: 0.53 mg/dL — ABNORMAL LOW (ref 0.61–1.24)
GFR calc Af Amer: >60 mL/min (ref >60)
GFR calc non Af Amer: >60 mL/min (ref >60)
Glucose, Bld: 70 mg/dL (ref 70–99)
Potassium: 3.6 mmol/L (ref 3.5–5.1)
Sodium: 139 mmol/L (ref 135–145)

## 2018-12-25 LAB — PH, BODY FLUID: pH, Body Fluid: 7.6

## 2018-12-25 LAB — PROTIME-INR
INR: 1.53
Prothrombin Time: 18.2 seconds — ABNORMAL HIGH (ref 11.4–15.2)

## 2018-12-25 LAB — MAGNESIUM: Magnesium: 1.6 mg/dL — ABNORMAL LOW (ref 1.7–2.4)

## 2018-12-25 NOTE — Progress Notes (Signed)
[facility]   PULMONARY CRITICAL CARE SERVICE  PROGRESS NOTE  Date of Service: 12/25/2018  Steven Wong  ZOX:[id number]  DOB: 05/10/1951   DOA: 12/15/2018  Referring Physician: Carron CurieAli Hijazi, MD  HPI: Steven CottaGerard Wong is a 67 y.o. male seen for follow up of Acute on Chronic Respiratory Failure.  Patient is on T collar oxygen is down to 35%.  Patient is noted to have increase in the left-sided effusion and atelectasis.  Spoke with respiratory therapy and patient on rounds and we are going to proceed with the interventional radiology to assess the patient and possibly do a thoracentesis.  He may also need a bronchoscopy.  Medications: Reviewed on Rounds  Physical Exam:  Vitals: Temperature 97.0 pulse 82 respiratory rate 15 blood pressure one 5/46 saturations 99%  Ventilator Settings off the ventilator on T collar  . General: Comfortable at this time . Eyes: Grossly normal lids, irises & conjunctiva . ENT: grossly tongue is normal . Neck: no obvious mass . Cardiovascular: S1 S2 normal no gallop . Respiratory: No rhonchi or rales are noted . Abdomen: soft . Skin: no rash seen on limited exam . Musculoskeletal: not rigid . Psychiatric:unable to assess . Neurologic: no seizure no involuntary movements         Lab Data:   Basic Metabolic Panel: Recent Labs  Lab 12/22/18 0541 12/22/18 2233 12/24/18 0624 12/25/18 0445  NA 138 138 139 139  K 4.0 3.7 3.4* 3.6  CL 101 100 99 102  CO2 27 31 32 29  GLUCOSE 79 94 73 70  BUN 13 11 8  5*  CREATININE 0.69 0.78 0.67 0.53*  CALCIUM 8.0* 7.8* 7.9* 7.6*  MG 1.4*  --  1.4* 1.6*  PHOS 4.2  --  3.6  --     ABG: Recent Labs  Lab 12/22/18 1452 12/22/18 2101  PHART 7.473* 7.473*  PCO2ART 39.2 40.9  PO2ART 46.5* 60.7*  HCO3 28.4* 29.6*  O2SAT 84.0 92.0    Liver Function Tests: Recent Labs  Lab 12/23/18 1302  PROT 5.8*   No results for input(s): LIPASE, AMYLASE in the last  168 hours. No results for input(s): AMMONIA in the last 168 hours.  CBC: Recent Labs  Lab 12/20/18 0633 12/22/18 0541 12/22/18 2233 12/24/18 0624 12/25/18 0445  WBC 11.3* 10.6* 13.3* 11.0* 12.7*  NEUTROABS 8.6*  --   --   --   --   HGB 11.1* 10.3* 9.9* 10.5* 9.4*  HCT 34.9* 33.0* 31.0* 32.8* 29.2*  MCV 97.8 97.3 94.5 96.8 96.7  PLT 294 297 296 335 293    Cardiac Enzymes: No results for input(s): CKTOTAL, CKMB, CKMBINDEX, TROPONINI in the last 168 hours.  BNP (last 3 results) No results for input(s): BNP in the last 8760 hours.  ProBNP (last 3 results) No results for input(s): PROBNP in the last 8760 hours.  Radiological Exams: Dg Chest Port 1 View  Result Date: 12/25/2018 CLINICAL DATA:  Pleural effusion. EXAM: PORTABLE CHEST 1 VIEW COMPARISON:  12/23/2018.  CT 12/22/2018. FINDINGS: Tracheostomy tube and left PICC line stable position. Heart size stable progressive atelectasis/infiltrate left lung base. Left-sided pleural effusion. Small right pleural effusion. No pneumothorax. IMPRESSION: 1.  Lines and tubes in stable position. 2. Progressive atelectasis/infiltrate left lung base. Moderate left-sided pleural effusion. Small right pleural effusion. Electronically Signed   By: Maisie Fushomas  Register   On: 12/25/2018 06:46    Assessment/Plan Active Problems:   Acute on chronic respiratory failure with hypoxia (  HCC)   Severe sepsis (HCC)   Chronic atrial fibrillation   Chronic systolic heart failure (HCC)   Atelectasis of left lung   Alcohol abuse with unspecified alcohol-induced disorder (HCC)   1. Acute on chronic respiratory failure with hypoxia we will continue with T collar at this time currently is on 35% FiO2. 2. Preparation for possible bronchoscopy changed to a cuffed trach. 3. Atelectasis of the lung probably fluid versus atelectasis patient is going to have a consultation with interventional radiology to see if there is a possibility of doing another  thoracentesis. 4. Pulmonary embolism patient is on Coumadin INR was approximately 1.53 today patient is still not fully therapeutic we will continue with supportive care 5. Chronic atrial fibrillation on Coumadin and also is rate controlled 6. Chronic systolic heart failure monitor fluids 7. Alcohol abuse with no active withdrawal noted   I have personally seen and evaluated the patient, evaluated laboratory and imaging results, formulated the assessment and plan and placed orders. The Patient requires high complexity decision making for assessment and support.  Case was discussed on Rounds with the Respiratory Therapy Staff  Yevonne PaxSaadat A Khan, MD [facility] Pulmonary Critical Care Medicine Sleep Medicine

## 2018-12-25 NOTE — Procedures (Signed)
Date: 12/25/2018,  MRN# [id number]    Procedure Note: Fiberoptic Bronchoscopy   PROCEDURE DATE: 12/25/2018     NAME:  Steven Wong   DOB:1951-07-03   MRN: [id number] LOC:  [id number]      Indications/Preliminary Diagnosis: Atelectasis left lung  Consent: (Place X beside choice/s below)  The benefits, risks and possible complications of the procedure were        explained to:  __X_ patient  __X_ patient's family  ___ other:___________  who verbalized understanding and gave:  ___ verbal  __X_ written  ___ verbal and written  ___ telephone  ___ other:________ consent.      Unable to obtain consent; procedure performed on emergent basis.     Other:      PRESEDATION ASSESSMENT: History and Physical has been performed. Patient meds and allergies have been reviewed. Presedation airway examination has been performed and documented. Baseline vital signs, sedation score, oxygenation status, and cardiac rhythm were reviewed. Patient was deemed to be in satisfactory condition to undergo the procedure.  PREMEDICATIONS:   Sedative/Narcotic Amt Dose   Versed 2 mg   Fentanyl 50 mcg  Diprivan  mg         Insertion Route (Place X beside choice below)   Nasal   Oral   Endotracheal Tube  X Tracheostomy   INTRAPROCEDURE MEDICATIONS:  Sedative/Narcotic Amt Dose   Versed 0 mg   Fentanyl 0 Mcg  Diprivan 0 mg       Medication Amt Dose  Medication Amt Dose  Xylocaine 2% 6 cc  Epinephrine 1:10,000 sol  cc  Xylocaine 4%  cc  Cocaine  cc   TECHNICAL PROCEDURES: (Place X beside choice below)   Procedures  Description  X  None     Electrocautery     Cryotherapy     Balloon Dilatation     Bronchography     Stent Placement     Therapeutic Aspiration     Laser/Argon Plasma            SPECIMENS (Sites): (Place X beside choice below)  Specimens Description   No Specimens Obtained     Washings   X Lavage  left upper lobe left lower lobe and lingula, right lower lobe right  middle lobe   Biopsies    Fine Needle Aspirates    Brushings    Sputum    FINDINGS:  ESTIMATED BLOOD LOSS: none  COMPLICATIONS/RESOLUTION: none  PROCEDURE DETAILS: Timeout performed and correct patient, name, & ID confirmed. Following prep per Pulmonary policy, appropriate sedation was administered.  Airway exam proceeded with findings, technical procedures, and specimen collection as noted below. At the end of exam the scope was withdrawn without incident. Impression and Plan as noted below.    Procedure Note: After informed consent was obtained as above patient was prepared in the usual manner.  Patient was given a total of 2 mg Versed and 50 mcg fentanyl preoperatively.  This achieved adequate sedation.  The fiberoptic scope was inserted through the tracheostomy tube and down to the carina.  The carina was found to be nice and sharp.  Next lidocaine was instilled for local topical anesthetic.  The patient's left lung was evaluated first.  He had very thick tenacious secretions which were plugging off the majority of the left lung.  The left lower lobe lingula and upper lobe underwent a bronchoalveolar lavage to clear the secretions.  After secretions were cleared the underlying mucosa appeared to be normal.  Scope was  then withdrawn and placed into the right lung.  The right upper lobe right middle lobe and lower lobe were examined.  The right lower lobe had a pedunculated polyp noted and this was not bloody.  I did perform a lavage and agitated the area a bit to be able to get enough specimen for cytology.  We will send the collected lavage for cytology.    IMPRESSION:POST-PROCEDURE DX: Mucous plugging of the left lung and small possible polyp versus mass in the right lower lobe.   RECOMMENDATION/PLAN: Patient will need a follow-up bronchoscopy and possible biopsy if cytology is negative we will reassess.  The patient's wife who was present was informed of the findings  I have personally  performed the procedure as noted above     Allyne Gee, MD [facility] Pulmonary Critical Care Medicine

## 2018-12-26 ENCOUNTER — Other Ambulatory Visit (HOSPITAL_COMMUNITY): Payer: Self-pay

## 2018-12-26 DIAGNOSIS — J9811 Atelectasis: Secondary | ICD-10-CM | POA: Diagnosis not present

## 2018-12-26 DIAGNOSIS — J9621 Acute and chronic respiratory failure with hypoxia: Secondary | ICD-10-CM | POA: Diagnosis not present

## 2018-12-26 DIAGNOSIS — I482 Chronic atrial fibrillation, unspecified: Secondary | ICD-10-CM | POA: Diagnosis not present

## 2018-12-26 DIAGNOSIS — F1019 Alcohol abuse with unspecified alcohol-induced disorder: Secondary | ICD-10-CM | POA: Diagnosis not present

## 2018-12-26 LAB — MAGNESIUM: MAGNESIUM: 1.6 mg/dL — AB (ref 1.7–2.4)

## 2018-12-26 LAB — BASIC METABOLIC PANEL
Anion gap: 7 (ref 5–15)
BUN: 5 mg/dL — AB (ref 8–23)
CO2: 30 mmol/L (ref 22–32)
Calcium: 7.6 mg/dL — ABNORMAL LOW (ref 8.9–10.3)
Chloride: 101 mmol/L (ref 98–111)
Creatinine, Ser: 0.49 mg/dL — ABNORMAL LOW (ref 0.61–1.24)
GFR calc Af Amer: >60 mL/min (ref >60)
GFR calc non Af Amer: >60 mL/min (ref >60)
Glucose, Bld: 88 mg/dL (ref 70–99)
Potassium: 3.4 mmol/L — ABNORMAL LOW (ref 3.5–5.1)
SODIUM: 138 mmol/L (ref 135–145)

## 2018-12-26 LAB — ACID FAST SMEAR (AFB)

## 2018-12-26 LAB — ACID FAST SMEAR (AFB, MYCOBACTERIA): Acid Fast Smear: NEGATIVE

## 2018-12-26 MED ORDER — LIDOCAINE HCL (PF) 1 % IJ SOLN
INTRAMUSCULAR | Status: AC
  Filled 2018-12-26: qty 30

## 2018-12-26 NOTE — Progress Notes (Signed)
Request for portable therapeutic thoracentesis today. Limited chest US shows small bilateral pleural effusions. Patient states that he does not want to have another procedure done unless it is absolutely necessary.  Discussed procedure indications, risks and benefits with patient who elected to postpone procedure today until the pleural effusions increased in size or he began to have dyspnea.   No procedure performed today. Please reorder thoracentesis in the future if you feel the patient would benefit from this procedure.  Lynnette CaffeyShannon Watterson, PA-C

## 2018-12-26 NOTE — Progress Notes (Signed)
[facility]   PULMONARY CRITICAL CARE SERVICE  PROGRESS NOTE  Date of Service: 12/26/2018  Steven CottaGerard Minshall  ZOX:[id number]  DOB: 08/31/1951   DOA: 12/15/2018  Referring Physician: Carron CurieAli Hijazi, MD  HPI: Steven Wong is a 67 y.o. male seen for follow up of Acute on Chronic Respiratory Failure.  Patient is on T collar currently on 35% FiO2.  Has been having a lot of issues with mucous plugs.  Secretions have been copious.  Medications: Reviewed on Rounds  Physical Exam:  Vitals: Temperature 97.1 pulse 89 respiratory rate 35 blood pressure 154/80 saturations 95%  Ventilator Settings currently is on T collar FiO2 is 35%  . General: Comfortable at this time . Eyes: Grossly normal lids, irises & conjunctiva . ENT: grossly tongue is normal . Neck: no obvious mass . Cardiovascular: S1 S2 normal no gallop . Respiratory: No rhonchi no rales are noted at this time . Abdomen: soft . Skin: no rash seen on limited exam . Musculoskeletal: not rigid . Psychiatric:unable to assess . Neurologic: no seizure no involuntary movements         Lab Data:   Basic Metabolic Panel: Recent Labs  Lab 12/22/18 0541 12/22/18 2233 12/24/18 0624 12/25/18 0445 12/26/18 0525  NA 138 138 139 139 138  K 4.0 3.7 3.4* 3.6 3.4*  CL 101 100 99 102 101  CO2 27 31 32 29 30  GLUCOSE 79 94 73 70 88  BUN 13 11 8  5* 5*  CREATININE 0.69 0.78 0.67 0.53* 0.49*  CALCIUM 8.0* 7.8* 7.9* 7.6* 7.6*  MG 1.4*  --  1.4* 1.6* 1.6*  PHOS 4.2  --  3.6  --   --     ABG: Recent Labs  Lab 12/22/18 1452 12/22/18 2101  PHART 7.473* 7.473*  PCO2ART 39.2 40.9  PO2ART 46.5* 60.7*  HCO3 28.4* 29.6*  O2SAT 84.0 92.0    Liver Function Tests: Recent Labs  Lab 12/23/18 1302  PROT 5.8*   No results for input(s): LIPASE, AMYLASE in the last 168 hours. No results for input(s): AMMONIA in the last 168 hours.  CBC: Recent Labs  Lab 12/20/18 0633 12/22/18 0541  12/22/18 2233 12/24/18 0624 12/25/18 0445  WBC 11.3* 10.6* 13.3* 11.0* 12.7*  NEUTROABS 8.6*  --   --   --   --   HGB 11.1* 10.3* 9.9* 10.5* 9.4*  HCT 34.9* 33.0* 31.0* 32.8* 29.2*  MCV 97.8 97.3 94.5 96.8 96.7  PLT 294 297 296 335 293    Cardiac Enzymes: No results for input(s): CKTOTAL, CKMB, CKMBINDEX, TROPONINI in the last 168 hours.  BNP (last 3 results) No results for input(s): BNP in the last 8760 hours.  ProBNP (last 3 results) No results for input(s): PROBNP in the last 8760 hours.  Radiological Exams: Dg Chest Port 1 View  Result Date: 12/25/2018 CLINICAL DATA:  Pleural effusion. EXAM: PORTABLE CHEST 1 VIEW COMPARISON:  12/23/2018.  CT 12/22/2018. FINDINGS: Tracheostomy tube and left PICC line stable position. Heart size stable progressive atelectasis/infiltrate left lung base. Left-sided pleural effusion. Small right pleural effusion. No pneumothorax. IMPRESSION: 1.  Lines and tubes in stable position. 2. Progressive atelectasis/infiltrate left lung base. Moderate left-sided pleural effusion. Small right pleural effusion. Electronically Signed   By: Maisie Fushomas  Register   On: 12/25/2018 06:46    Assessment/Plan Active Problems:   Acute on chronic respiratory failure with hypoxia (HCC)   Severe sepsis (HCC)   Chronic atrial fibrillation   Chronic systolic  heart failure (HCC)   Atelectasis of left lung   Alcohol abuse with unspecified alcohol-induced disorder (HCC)   1. Acute on chronic respiratory failure with hypoxia we will continue with T collar continue pulmonary toilet supportive care. 2. Severe sepsis hemodynamically stable at this time 3. Chronic atrial fibrillation rate is controlled 4. Chronic systolic heart failure appears more compensated 5. Atelectasis continue aggressive pulmonary toilet status post bronchoscopy for mucous plugging. 6. Alcohol abuse stable no withdrawal noted   I have personally seen and evaluated the patient, evaluated laboratory and  imaging results, formulated the assessment and plan and placed orders. The Patient requires high complexity decision making for assessment and support.  Case was discussed on Rounds with the Respiratory Therapy Staff  Yevonne PaxSaadat A Khan, MD [facility] Pul[facility] Critical Care Medicine Sleep Medicine

## 2018-12-27 ENCOUNTER — Other Ambulatory Visit (HOSPITAL_COMMUNITY): Payer: Self-pay

## 2018-12-27 DIAGNOSIS — J9621 Acute and chronic respiratory failure with hypoxia: Secondary | ICD-10-CM | POA: Diagnosis not present

## 2018-12-27 DIAGNOSIS — J9811 Atelectasis: Secondary | ICD-10-CM | POA: Diagnosis not present

## 2018-12-27 DIAGNOSIS — I482 Chronic atrial fibrillation, unspecified: Secondary | ICD-10-CM | POA: Diagnosis not present

## 2018-12-27 DIAGNOSIS — F1019 Alcohol abuse with unspecified alcohol-induced disorder: Secondary | ICD-10-CM | POA: Diagnosis not present

## 2018-12-27 LAB — BLOOD GAS, ARTERIAL
Acid-Base Excess: 6.2 mmol/L — ABNORMAL HIGH (ref 0.0–2.0)
Acid-Base Excess: 6.6 mmol/L — ABNORMAL HIGH (ref 0.0–2.0)
Bicarbonate: 29.7 mmol/L — ABNORMAL HIGH (ref 20.0–28.0)
Bicarbonate: 30 mmol/L — ABNORMAL HIGH (ref 20.0–28.0)
Drawn by: 519031
FIO2: 98
FIO2: 60
MECHVT: 450 mL
O2 SAT: 91.1 %
O2 Saturation: 88.6 %
PATIENT TEMPERATURE: 98.6
PEEP: 5 cmH2O
Patient temperature: 98.6
RATE: 12 resp/min
pCO2 arterial: 39.9 mmHg (ref 32.0–48.0)
pCO2 arterial: 37.8 mmHg (ref 32.0–48.0)
pH, Arterial: 7.485 — ABNORMAL HIGH (ref 7.350–7.450)
pH, Arterial: 7.51 — ABNORMAL HIGH (ref 7.350–7.450)
pO2, Arterial: 53.1 mmHg — ABNORMAL LOW (ref 83.0–108.0)
pO2, Arterial: 57.6 mmHg — ABNORMAL LOW (ref 83.0–108.0)

## 2018-12-27 LAB — POTASSIUM: Potassium: 3.4 mmol/L — ABNORMAL LOW (ref 3.5–5.1)

## 2018-12-27 LAB — CULTURE, BAL-QUANTITATIVE W GRAM STAIN: Culture: 70000 — AB

## 2018-12-27 LAB — MAGNESIUM: Magnesium: 1.7 mg/dL (ref 1.7–2.4)

## 2018-12-27 NOTE — Progress Notes (Addendum)
[facility]   PULMONARY CRITICAL CARE SERVICE  PROGRESS NOTE  Date of Service: 12/27/2018  Steven Wong  AVW:[id number]  DOB: 02/27/1951   DOA: 12/15/2018  Referring Physician: Carron CurieAli Hijazi, MD  HPI: Steven Wong is a 68 y.o. male seen for follow up of Acute on Chronic Respiratory Failure.  Patient had episode of desaturation today.  Patient was placed on 40% FiO2 with no improvement.  He was then placed on 98% FiO2 and a gas was obtained.  Gas showed a pH of 7.48 with a PO2 of 53.1.  Patient was placed back on vent.  Chest x-ray was performed and showed an increase in size of the left pleural effusion since the previous x-ray 2 days ago.  Stable dense atelectasis or pneumonia is noted in the left lower lobe and lingula.  There is also a stable small to moderate right pleural effusion and mild right basilar atelectasis.  After being on the vent for an hour and another arterial blood gas was obtained this time pH 7.5 with AP O2 of 57.6.  Medications: Reviewed on Rounds  Physical Exam:  Vitals: Pulse 75 respirations 14 blood pressure 133/74 O2 sat 100% temp 97.6  Ventilator Settings ventilator mode AC VC rate of 12 tidal volume 450 PEEP of 5 FiO2 60%  . General: Comfortable at this time . Eyes: Grossly normal lids, irises & conjunctiva . ENT: grossly tongue is normal . Neck: no obvious mass . Cardiovascular: S1 S2 normal no gallop . Respiratory: Coarse breath sounds noted . Abdomen: soft . Skin: no rash seen on limited exam . Musculoskeletal: not rigid . Psychiatric:unable to assess . Neurologic: no seizure no involuntary movements         Lab Data:   Basic Metabolic Panel: Recent Labs  Lab 12/22/18 0541 12/22/18 2233 12/24/18 0624 12/25/18 0445 12/26/18 0525 12/27/18 0605  NA 138 138 139 139 138  --   K 4.0 3.7 3.4* 3.6 3.4* 3.4*  CL 101 100 99 102 101  --   CO2 27 31 32 29 30  --   GLUCOSE 79 94 73 70 88  --    BUN 13 11 8  5* 5*  --   CREATININE 0.69 0.78 0.67 0.53* 0.49*  --   CALCIUM 8.0* 7.8* 7.9* 7.6* 7.6*  --   MG 1.4*  --  1.4* 1.6* 1.6* 1.7  PHOS 4.2  --  3.6  --   --   --     ABG: Recent Labs  Lab 12/22/18 1452 12/22/18 2101 12/27/18 1215 12/27/18 1505  PHART 7.473* 7.473* 7.485* 7.510*  PCO2ART 39.2 40.9 39.9 37.8  PO2ART 46.5* 60.7* 53.1* 57.6*  HCO3 28.4* 29.6* 29.7* 30.0*  O2SAT 84.0 92.0 88.6 91.1    Liver Function Tests: Recent Labs  Lab 12/23/18 1302  PROT 5.8*   No results for input(s): LIPASE, AMYLASE in the last 168 hours. No results for input(s): AMMONIA in the last 168 hours.  CBC: Recent Labs  Lab 12/22/18 0541 12/22/18 2233 12/24/18 0624 12/25/18 0445  WBC 10.6* 13.3* 11.0* 12.7*  HGB 10.3* 9.9* 10.5* 9.4*  HCT 33.0* 31.0* 32.8* 29.2*  MCV 97.3 94.5 96.8 96.7  PLT 297 296 335 293    Cardiac Enzymes: No results for input(s): CKTOTAL, CKMB, CKMBINDEX, TROPONINI in the last 168 hours.  BNP (last 3 results) No results for input(s): BNP in the last 8760 hours.  ProBNP (last 3 results) No results for input(s): PROBNP  in the last 8760 hours.  Radiological Exams: [place] Chest (pleural Effusion)  Result Date: 12/26/2018 CLINICAL DATA:  68 year old with pleural effusions. Request for thoracentesis. Prior left thoracentesis. EXAM: CHEST ULTRASOUND COMPARISON:  Chest radiograph 12/25/2018 FINDINGS: Ultrasound demonstrated bilateral pleural effusions. Thoracentesis was discussed with the patient but the patient did not want another thoracentesis at this time. IMPRESSION: Bilateral pleural effusions. Thoracentesis not performed per patient request. Electronically Signed   By: Richarda Overlie M.D.   On: 12/26/2018 17:05   Dg Chest Port 1 View  Result Date: 12/27/2018 CLINICAL DATA:  Chronic ventilator dependent respiratory failure. Sepsis. Acute BILATERAL pulmonary emboli. Follow-up BILATERAL pleural effusions and associated BILATERAL atelectasis and/or pneumonia,  LEFT greater than RIGHT. EXAM: PORTABLE CHEST 1 VIEW COMPARISON:  12/25/2018, 12/23/2018, 12/22/2018, 12/16/2018 and CTA chest 12/22/2018. FINDINGS: Tracheostomy tube tip in satisfactory position below the thoracic inlet, unchanged. LEFT arm PICC looped back upon itself distally at the level of the proximal SVC. Since the examination 2 days ago, further increase in size of the large LEFT pleural effusion. Stable dense consolidation in the LEFT LOWER LOBE and lingula. No change in the small to moderate-sized RIGHT pleural effusion and mild atelectasis at the RIGHT lung base. Lungs otherwise clear. Pulmonary vascularity normal. IMPRESSION: 1. Further increase in size of the large LEFT pleural effusion since the examination 2 days ago. 2. Stable dense atelectasis and/or pneumonia in the LEFT LOWER LOBE and lingula. 3. Stable small to moderate-sized RIGHT pleural effusion and mild RIGHT basilar atelectasis. 4. LEFT arm PICC tip may be in the azygos vein. If there are difficulties with use of the PICC, withdrawing the catheter approximately 4-5 cm would result in positioning within the innominate vein. Electronically Signed   By: Hulan Saas M.D.   On: 12/27/2018 12:57    Assessment/Plan Active Problems:   Acute on chronic respiratory failure with hypoxia (HCC)   Severe sepsis (HCC)   Chronic atrial fibrillation   Chronic systolic heart failure (HCC)   Atelectasis of left lung   Alcohol abuse with unspecified alcohol-induced disorder (HCC)   1. Acute on chronic respiratory failure with hypoxia continue ventilator support at this time.  Continue aggressive pulmonary toilet and supportive care. 2. Severe sepsis hemodynamically stable at this time 3. Chronic atrial fibrillation rate is controlled 4. Chronic systolic heart failure appears more compensated 5. Atelectasis continue aggressive pulmonary toilet. 6. Alcohol abuse stable no withdrawal noted 7. Left lower lobe and lingular pneumonia patient  will be started on cefepime.   I have personally seen and evaluated the patient, evaluated laboratory and imaging results, formulated the assessment and plan and placed orders. The Patient requires high complexity decision making for assessment and support.  Case was discussed on Rounds with the Respiratory Therapy Staff  Yevonne Pax, MD [facility] Pulmonary Critical Care Medicine Sleep Medicine

## 2018-12-28 ENCOUNTER — Other Ambulatory Visit (HOSPITAL_COMMUNITY): Payer: Self-pay

## 2018-12-28 ENCOUNTER — Encounter: Payer: Self-pay | Admitting: Internal Medicine

## 2018-12-28 DIAGNOSIS — J9811 Atelectasis: Secondary | ICD-10-CM | POA: Diagnosis not present

## 2018-12-28 DIAGNOSIS — I482 Chronic atrial fibrillation, unspecified: Secondary | ICD-10-CM | POA: Diagnosis not present

## 2018-12-28 DIAGNOSIS — F1019 Alcohol abuse with unspecified alcohol-induced disorder: Secondary | ICD-10-CM | POA: Diagnosis not present

## 2018-12-28 DIAGNOSIS — J95811 Postprocedural pneumothorax: Secondary | ICD-10-CM | POA: Diagnosis present

## 2018-12-28 DIAGNOSIS — J9621 Acute and chronic respiratory failure with hypoxia: Secondary | ICD-10-CM | POA: Diagnosis not present

## 2018-12-28 LAB — CULTURE, BODY FLUID-BOTTLE: CULTURE: NO GROWTH

## 2018-12-28 LAB — CBC
HEMATOCRIT: 29.9 % — AB (ref 39.0–52.0)
Hemoglobin: 9.4 g/dL — ABNORMAL LOW (ref 13.0–17.0)
MCH: 29.9 pg (ref 26.0–34.0)
MCHC: 31.4 g/dL (ref 30.0–36.0)
MCV: 95.2 fL (ref 80.0–100.0)
Platelets: 289 10*3/uL (ref 150–400)
RBC: 3.14 MIL/uL — ABNORMAL LOW (ref 4.22–5.81)
RDW: 16.9 % — AB (ref 11.5–15.5)
WBC: 12.1 10*3/uL — ABNORMAL HIGH (ref 4.0–10.5)
nRBC: 0 % (ref 0.0–0.2)

## 2018-12-28 LAB — BLOOD GAS, ARTERIAL
Acid-Base Excess: 7.4 mmol/L — ABNORMAL HIGH (ref 0.0–2.0)
Bicarbonate: 30.7 mmol/L — ABNORMAL HIGH (ref 20.0–28.0)
FIO2: 0.6
MECHVT: 450 mL
O2 Saturation: 97.1 %
PEEP: 5 cmH2O
Patient temperature: 98.6
RATE: 12 resp/min
pCO2 arterial: 38.3 mmHg (ref 32.0–48.0)
pH, Arterial: 7.516 — ABNORMAL HIGH (ref 7.350–7.450)
pO2, Arterial: 115 mmHg — ABNORMAL HIGH (ref 83.0–108.0)

## 2018-12-28 LAB — CULTURE, BODY FLUID W GRAM STAIN -BOTTLE

## 2018-12-28 LAB — BASIC METABOLIC PANEL
Anion gap: 9 (ref 5–15)
BUN: 7 mg/dL — ABNORMAL LOW (ref 8–23)
CHLORIDE: 100 mmol/L (ref 98–111)
CO2: 28 mmol/L (ref 22–32)
Calcium: 7.7 mg/dL — ABNORMAL LOW (ref 8.9–10.3)
Creatinine, Ser: 0.7 mg/dL (ref 0.61–1.24)
GFR calc Af Amer: >60 mL/min (ref >60)
GFR calc non Af Amer: >60 mL/min (ref >60)
Glucose, Bld: 79 mg/dL (ref 70–99)
Potassium: 3.7 mmol/L (ref 3.5–5.1)
Sodium: 137 mmol/L (ref 135–145)

## 2018-12-28 LAB — MAGNESIUM: Magnesium: 1.6 mg/dL — ABNORMAL LOW (ref 1.7–2.4)

## 2018-12-28 MED ORDER — MIDAZOLAM HCL 2 MG/2ML IJ SOLN
INTRAMUSCULAR | Status: AC
  Filled 2018-12-28: qty 4

## 2018-12-28 MED ORDER — FENTANYL CITRATE (PF) 100 MCG/2ML IJ SOLN
INTRAMUSCULAR | Status: AC
  Filled 2018-12-28: qty 4

## 2018-12-28 MED ORDER — MIDAZOLAM HCL 2 MG/2ML IJ SOLN
INTRAMUSCULAR | Status: AC | PRN
  Administered 2018-12-28: 0.5 mg via INTRAVENOUS
  Administered 2018-12-28: 1 mg via INTRAVENOUS

## 2018-12-28 MED ORDER — FENTANYL CITRATE (PF) 100 MCG/2ML IJ SOLN
INTRAMUSCULAR | Status: AC | PRN
  Administered 2018-12-28: 50 ug via INTRAVENOUS
  Administered 2018-12-28: 25 ug via INTRAVENOUS

## 2018-12-28 MED ORDER — LIDOCAINE HCL 1 % IJ SOLN
INTRAMUSCULAR | Status: AC
  Filled 2018-12-28: qty 20

## 2018-12-28 MED ORDER — LIDOCAINE HCL (PF) 1 % IJ SOLN
INTRAMUSCULAR | Status: AC
  Filled 2018-12-28: qty 30

## 2018-12-28 NOTE — Progress Notes (Signed)
[facility]   PULMONARY CRITICAL CARE SERVICE  PROGRESS NOTE  Date of Service: 12/28/2018  Steven Wong  ZOX:[id number]  DOB: 01/24/1951   DOA: 12/15/2018  Referring Physician: Carron CurieAli Hijazi, MD  HPI: Steven Wong is a 68 y.o. male seen for follow up of Acute on Chronic Respiratory Failure.  Patient underwent another thoracentesis unfortunately developed a pneumothorax post procedure.  About 550 cc of fluid was removed.  Patient currently is comfortable his saturations are adequate.  The patient is on the ventilator and his blood pressure and airway pressures are fine.  I spoke with the primary care team and I think is because of the recurrence of the pleural effusions it may be better to try to place a drainage catheter on the left side.  Medications: Reviewed on Rounds  Physical Exam:  Vitals: Temperature 97.9 pulse 82 respiratory rate 20 blood pressure 117/57 saturations 99%  Ventilator Settings mode ventilation assist control FiO2 40% tidal volume 450 PEEP 5  . General: Comfortable at this time . Eyes: Grossly normal lids, irises & conjunctiva . ENT: grossly tongue is normal . Neck: no obvious mass . Cardiovascular: S1 S2 normal no gallop . Respiratory: No rhonchi or rales are noted at this time . Abdomen: soft . Skin: no rash seen on limited exam . Musculoskeletal: not rigid . Psychiatric:unable to assess . Neurologic: no seizure no involuntary movements         Lab Data:   Basic Metabolic Panel: Recent Labs  Lab 12/22/18 0541 12/22/18 2233 12/24/18 0624 12/25/18 0445 12/26/18 0525 12/27/18 0605 12/28/18 0458  NA 138 138 139 139 138  --  137  K 4.0 3.7 3.4* 3.6 3.4* 3.4* 3.7  CL 101 100 99 102 101  --  100  CO2 27 31 32 29 30  --  28  GLUCOSE 79 94 73 70 88  --  79  BUN 13 11 8  5* 5*  --  7*  CREATININE 0.69 0.78 0.67 0.53* 0.49*  --  0.70  CALCIUM 8.0* 7.8* 7.9* 7.6* 7.6*  --  7.7*  MG 1.4*  --  1.4*  1.6* 1.6* 1.7 1.6*  PHOS 4.2  --  3.6  --   --   --   --     ABG: Recent Labs  Lab 12/22/18 1452 12/22/18 2101 12/27/18 1215 12/27/18 1505 12/28/18 0443  PHART 7.473* 7.473* 7.485* 7.510* 7.516*  PCO2ART 39.2 40.9 39.9 37.8 38.3  PO2ART 46.5* 60.7* 53.1* 57.6* 115*  HCO3 28.4* 29.6* 29.7* 30.0* 30.7*  O2SAT 84.0 92.0 88.6 91.1 97.1    Liver Function Tests: Recent Labs  Lab 12/23/18 1302  PROT 5.8*   No results for input(s): LIPASE, AMYLASE in the last 168 hours. No results for input(s): AMMONIA in the last 168 hours.  CBC: Recent Labs  Lab 12/22/18 0541 12/22/18 2233 12/24/18 0624 12/25/18 0445 12/28/18 0458  WBC 10.6* 13.3* 11.0* 12.7* 12.1*  HGB 10.3* 9.9* 10.5* 9.4* 9.4*  HCT 33.0* 31.0* 32.8* 29.2* 29.9*  MCV 97.3 94.5 96.8 96.7 95.2  PLT 297 296 335 293 289    Cardiac Enzymes: No results for input(s): CKTOTAL, CKMB, CKMBINDEX, TROPONINI in the last 168 hours.  BNP (last 3 results) No results for input(s): BNP in the last 8760 hours.  ProBNP (last 3 results) No results for input(s): PROBNP in the last 8760 hours.  Radiological Exams: Dg Chest Port 1 View  Result Date: 12/28/2018 CLINICAL DATA:  Post  left thoracentesis EXAM: PORTABLE CHEST 1 VIEW COMPARISON:  12/27/2018 FINDINGS: Decreasing left effusion following thoracentesis. No pneumothorax following thoracentesis. Cardiomegaly. Left lower lobe atelectasis or infiltrate. Small to moderate bilateral pleural effusions. Moderate-sized right pneumothorax. No reported thoracentesis on the right side. Tracheostomy is unchanged. Left PICC line tip remains coiled in the left innominate vein. IMPRESSION: Decreasing left effusion following left thoracentesis. No pneumothorax on the left. There is moderate bilateral effusions and moderate-sized right pneumothorax. No reported right thoracentesis. Left lower lobe atelectasis or infiltrate. These results were called by telephone at the time of interpretation on  12/28/2018 at 12:22 pm to Dr. Lynnette Caffey , who verbally acknowledged these results. Electronically Signed   By: Charlett Nose M.D.   On: 12/28/2018 12:24   Dg Chest Port 1 View  Result Date: 12/27/2018 CLINICAL DATA:  Chronic ventilator dependent respiratory failure. Sepsis. Acute BILATERAL pulmonary emboli. Follow-up BILATERAL pleural effusions and associated BILATERAL atelectasis and/or pneumonia, LEFT greater than RIGHT. EXAM: PORTABLE CHEST 1 VIEW COMPARISON:  12/25/2018, 12/23/2018, 12/22/2018, 12/16/2018 and CTA chest 12/22/2018. FINDINGS: Tracheostomy tube tip in satisfactory position below the thoracic inlet, unchanged. LEFT arm PICC looped back upon itself distally at the level of the proximal SVC. Since the examination 2 days ago, further increase in size of the large LEFT pleural effusion. Stable dense consolidation in the LEFT LOWER LOBE and lingula. No change in the small to moderate-sized RIGHT pleural effusion and mild atelectasis at the RIGHT lung base. Lungs otherwise clear. Pulmonary vascularity normal. IMPRESSION: 1. Further increase in size of the large LEFT pleural effusion since the examination 2 days ago. 2. Stable dense atelectasis and/or pneumonia in the LEFT LOWER LOBE and lingula. 3. Stable small to moderate-sized RIGHT pleural effusion and mild RIGHT basilar atelectasis. 4. LEFT arm PICC tip may be in the azygos vein. If there are difficulties with use of the PICC, withdrawing the catheter approximately 4-5 cm would result in positioning within the innominate vein. Electronically Signed   By: Hulan Saas M.D.   On: 12/27/2018 12:57   US Thoracentesis Asp Pleural Space W/img Guide  Result Date: 12/28/2018 Kieth Brightly     12/28/2018 12:25 PM PROCEDURE SUMMARY: Successful image-guided left thoracentesis. Yielded 550 milliliters of clear yellow fluid. Patient tolerated procedure well. EBL: Zero No immediate complications. Specimen was not sent for labs. Post  procedure CXR shows no pneumothorax. Please see imaging section of Epic for full dictation. Villa Herb PA-C 12/28/2018 12:02 PM    Assessment/Plan Active Problems:   Acute on chronic respiratory failure with hypoxia (HCC)   Severe sepsis (HCC)   Chronic atrial fibrillation   Chronic systolic heart failure (HCC)   Atelectasis of left lung   Alcohol abuse with unspecified alcohol-induced disorder (HCC)   Iatrogenic pneumothorax   1. Acute on chronic respiratory failure with hypoxia patient will be continued with full supportive care at this time titrate oxygen down since his saturations are actually excellent would also try to wean down the ventilator if possible to spontaneous breathing trials. 2. Severe sepsis patient right now is hemodynamically stable we will continue with supportive care 3. Chronic atrial fibrillation rate is controlled 4. Chronic systolic heart failure at baseline we will continue with supportive care monitor fluid status 5. Alcohol abuse at baseline continue with supportive care no active withdrawal 6. Iatrogenic pneumothorax from thoracentesis will need chest tube drainage more than likely appears to be developing into a tension pneumothorax   I have personally seen and  evaluated the patient, evaluated laboratory and imaging results, formulated the assessment and plan and placed orders.  Time spent 35 minutes development of pneumothorax and discussion with the medical staff The Patient requires high complexity decision making for assessment and support.  Case was discussed on Rounds with the Respiratory Therapy Staff  Yevonne PaxSaadat A Khan, MD [facility] Pulmonary Critical Care Medicine Sleep Medicine

## 2018-12-28 NOTE — Procedures (Signed)
R 12 Fr chest tube EBL 0 Comp 0

## 2018-12-28 NOTE — Procedures (Addendum)
PROCEDURE SUMMARY:  Successful image-guided left thoracentesis. Yielded 550 milliliters of clear yellow fluid. Patient tolerated procedure well. EBL: Zero No immediate complications.  Specimen was not sent for labs. Post procedure CXR shows no pneumothorax on the left side - however there is right sided pneumothorax with tension component noted. No procedure was performed on the right side today. Discussed with ordering provider who states they do not have anyone who can place a bedside chest tube - patient will be brought to IR for chest tube placement.   Please see imaging section of Epic for full dictation.  Villa Herb PA-C 12/28/2018 12:02 PM

## 2018-12-28 NOTE — H&P (Signed)
Chief Complaint: Patient was seen in consultation today for right chest tube placement due to pneumothorax  Referring Physician(s): Camelia EngPriya Varghese, NP  Supervising Physician: Jolaine ClickHoss, Arthur  [facility]  History of Present Illness: Steven CottaGerard Wong is a 68 y.o. male with a past medical history of ETOH abuse, CHF, a.fib, acute on chronic respiratory failure and recurrent pleural effusion. Patient currently with tracheostomy and ventilator dependent after failure to wean several days ago. Patient underwent successful left sided thoracentesis earlier today, follow up CXR showed no pneumothorax on the left however a new right sided pneumothorax with tension component was present. Per ordering provider there is no one available to place a bedside chest tube. They report patient's vital signs as stable, no respiratory distress noted, on lovenox SQ for anticoagulation with last dose this AM. Images were reviewed with Dr. Bonnielee HaffHoss who recommends chest tube placement today in IR.    Past Medical History:  Diagnosis Date  . Acute on chronic respiratory failure with hypoxia (HCC)   . 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 Atelectasis of left lung   . Chronic atrial fibrillation   . Chronic systolic heart failure (HCC)   . Severe sepsis (HCC)     Allergies: Patient has no allergy information on record.  Medications: Prior to Admission medications   Not on File     No family history on file.  Social History   Socioeconomic History  . Marital status: Married    Spouse name: Not on file  . Number of children: Not on file  . Years of education: Not on file  . Highest education level: Not on file  Occupational History  . Not on file  Social Needs  . Financial resource strain: Not on file  . Food insecurity:    Worry: Not on file    Inability: Not on file  . Transportation needs:    Medical: Not on file    Non-medical: Not on file  Tobacco Use    . Smoking status: Not on file  Substance and Sexual Activity  . Alcohol use: Not on file  . Drug use: Not on file  . Sexual activity: Not on file  Lifestyle  . Physical activity:    Days per week: Not on file    Minutes per session: Not on file  . Stress: Not on file  Relationships  . Social connections:    Talks on phone: Not on file    Gets together: Not on file    Attends religious service: Not on file    Active member of club or organization: Not on file    Attends meetings of clubs or organizations: Not on file    Relationship status: Not on file  Other Topics Concern  . Not on file  Social History Narrative  . Not on file     Review of Systems: A 12 point ROS discussed and pertinent positives are indicated in the HPI above.  All other systems are negative.  Review of Systems  Unable to perform ROS: Patient nonverbal    Vital Signs: There were no vitals taken for this visit.  Physical Exam Constitutional:      General: He is not in acute distress.    Appearance: He is ill-appearing.  HENT:     Head: Normocephalic and atraumatic.  Pulmonary:     Comments: (+) tracheostomy, (+) ventilated Abdominal:     Palpations: Abdomen is soft.  Skin:  General: Skin is warm and dry.     Comments: Multiple tattoos  Neurological:     Mental Status: He is alert.     Imaging: Ct Angio Chest Pe W Or Wo Contrast  Result Date: 12/22/2018 CLINICAL DATA:  Pulmonary edema. EXAM: CT ANGIOGRAPHY CHEST WITH CONTRAST TECHNIQUE: Multidetector CT imaging of the chest was performed using the standard protocol during bolus administration of intravenous contrast. Multiplanar CT image reconstructions and MIPs were obtained to evaluate the vascular anatomy. CONTRAST:  65 ml ISOVUE-370 IOPAMIDOL (ISOVUE-370) INJECTION 76% COMPARISON:  Chest x-ray on 12/22/2018 FINDINGS: Cardiovascular: The heart is enlarged. There is trace pericardial effusion. There is dense atherosclerotic calcification  of coronary arteries. Dense atherosclerotic calcification of the thoracic aorta not associated with aneurysm. The pulmonary artery is markedly enlarged, measuring 4.8 centimeters. The RV: LV ratio is 1.0 but this may be affected by the longstanding pulmonary arterial hypertension and overall cardiomegaly. There are numerous emboli within the pulmonary arteries. Within a LEFT LOWER lobe branch, small embolus is identified on image 113/7. subtle emboli are identified within RIGHT LOWER lobe branches on image 143/7. RIGHT middle lobe embolus on image 151/7. Mediastinum/Nodes: Tracheostomy tube in place above the carina. The esophagus contains air but is otherwise unremarkable. Lungs/Pleura: Bilateral pleural effusions and bilateral LOWER lobe consolidations combined with atelectasis. Infarcts could have a similar appearance and are difficult to exclude. Within the aerated lung in the lung apices, there are patchy areas of ground-glass opacity, favoring infectious process. Upper Abdomen: No acute abnormality. Musculoskeletal: Degenerative changes are seen in the thoracic spine. No suspicious lytic or blastic lesions are identified. Review of the MIP images confirms the above findings. IMPRESSION: 1. Bilateral pulmonary emboli. 2. The RV: LV: Ratio is elevated (1.0). However, the validity of this measurement is not known, given the presence of pulmonary arterial hypertension and marked enlargement of the pulmonary artery. 3. Bilateral pleural effusions. Bilateral LOWER lobe consolidations and/or infarcts. Airspace filling opacity in the remaining aerated portion of the lungs favors infectious process over pulmonary edema. 4. Tracheostomy tube in normal position. Critical Value/emergent results were called by telephone at the time of interpretation on 12/22/2018 at 9:33 pm to Dr. Bess Harvestana, who verbally acknowledged these results. Electronically Signed   By: Norva PavlovElizabeth  Brown M.D.   On: 12/22/2018 21:34   [place] Chest (pleural  Effusion)  Result Date: 12/26/2018 CLINICAL DATA:  68 year old with pleural effusions. Request for thoracentesis. Prior left thoracentesis. EXAM: CHEST ULTRASOUND COMPARISON:  Chest radiograph 12/25/2018 FINDINGS: Ultrasound demonstrated bilateral pleural effusions. Thoracentesis was discussed with the patient but the patient did not want another thoracentesis at this time. IMPRESSION: Bilateral pleural effusions. Thoracentesis not performed per patient request. Electronically Signed   By: Richarda OverlieAdam  Henn M.D.   On: 12/26/2018 17:05   Dg Chest Port 1 View  Result Date: 12/28/2018 CLINICAL DATA:  Post left thoracentesis EXAM: PORTABLE CHEST 1 VIEW COMPARISON:  12/27/2018 FINDINGS: Decreasing left effusion following thoracentesis. No pneumothorax following thoracentesis. Cardiomegaly. Left lower lobe atelectasis or infiltrate. Small to moderate bilateral pleural effusions. Moderate-sized right pneumothorax. No reported thoracentesis on the right side. Tracheostomy is unchanged. Left PICC line tip remains coiled in the left innominate vein. IMPRESSION: Decreasing left effusion following left thoracentesis. No pneumothorax on the left. There is moderate bilateral effusions and moderate-sized right pneumothorax. No reported right thoracentesis. Left lower lobe atelectasis or infiltrate. These results were called by telephone at the time of interpretation on 12/28/2018 at 12:22 pm to Dr. Lynnette CaffeySHANNON  , who verbally  acknowledged these results. Electronically Signed   By: Charlett Nose M.D.   On: 12/28/2018 12:24   Dg Chest Port 1 View  Result Date: 12/27/2018 CLINICAL DATA:  Chronic ventilator dependent respiratory failure. Sepsis. Acute BILATERAL pulmonary emboli. Follow-up BILATERAL pleural effusions and associated BILATERAL atelectasis and/or pneumonia, LEFT greater than RIGHT. EXAM: PORTABLE CHEST 1 VIEW COMPARISON:  12/25/2018, 12/23/2018, 12/22/2018, 12/16/2018 and CTA chest 12/22/2018. FINDINGS: Tracheostomy  tube tip in satisfactory position below the thoracic inlet, unchanged. LEFT arm PICC looped back upon itself distally at the level of the proximal SVC. Since the examination 2 days ago, further increase in size of the large LEFT pleural effusion. Stable dense consolidation in the LEFT LOWER LOBE and lingula. No change in the small to moderate-sized RIGHT pleural effusion and mild atelectasis at the RIGHT lung base. Lungs otherwise clear. Pulmonary vascularity normal. IMPRESSION: 1. Further increase in size of the large LEFT pleural effusion since the examination 2 days ago. 2. Stable dense atelectasis and/or pneumonia in the LEFT LOWER LOBE and lingula. 3. Stable small to moderate-sized RIGHT pleural effusion and mild RIGHT basilar atelectasis. 4. LEFT arm PICC tip may be in the azygos vein. If there are difficulties with use of the PICC, withdrawing the catheter approximately 4-5 cm would result in positioning within the innominate vein. Electronically Signed   By: Hulan Saas M.D.   On: 12/27/2018 12:57   Dg Chest Port 1 View  Result Date: 12/25/2018 CLINICAL DATA:  Pleural effusion. EXAM: PORTABLE CHEST 1 VIEW COMPARISON:  12/23/2018.  CT 12/22/2018. FINDINGS: Tracheostomy tube and left PICC line stable position. Heart size stable progressive atelectasis/infiltrate left lung base. Left-sided pleural effusion. Small right pleural effusion. No pneumothorax. IMPRESSION: 1.  Lines and tubes in stable position. 2. Progressive atelectasis/infiltrate left lung base. Moderate left-sided pleural effusion. Small right pleural effusion. Electronically Signed   By: Maisie Fus  Register   On: 12/25/2018 06:46   Dg Chest Port 1 View  Result Date: 12/23/2018 CLINICAL DATA:  Post left thoracentesis. EXAM: PORTABLE CHEST 1 VIEW COMPARISON:  12/22/2018 FINDINGS: Improved aeration in the left lung. Residual densities at the left lung base. Negative for a left pneumothorax. There continues to be volume loss in the right  lung with blunting at the right costophrenic angle and evidence for right pleural effusion. Cardiac silhouette is grossly stable. Atherosclerotic calcifications at the aortic arch. Left arm PICC line tip is near the upper SVC and may be pointing towards the azygos vein. Patient has a tracheostomy tube. IMPRESSION: 1. Improved aeration at the left lung base following thoracentesis. Negative for pneumothorax. 2. Right pleural effusion. 3. PICC line tip in the upper SVC as described. Electronically Signed   By: Richarda Overlie M.D.   On: 12/23/2018 13:46   Dg Chest Port 1 View  Result Date: 12/22/2018 CLINICAL DATA:  Increased oxygen demand EXAM: PORTABLE CHEST 1 VIEW COMPARISON:  12/16/2018 FINDINGS: Stable left upper extremity PICC. Lungs are very under aerated. Bilateral pleural effusions are not significantly changed. There is central consolidation both lungs. No pneumothorax. Stable tracheostomy tube. IMPRESSION: Bilateral pleural effusions, low lung volumes, and central consolidation are stable. Electronically Signed   By: Jolaine Click M.D.   On: 12/22/2018 16:24   Dg Chest Port 1 View  Result Date: 12/16/2018 CLINICAL DATA:  Respiratory failure per requisition EXAM: PORTABLE CHEST 1 VIEW COMPARISON:  None. FINDINGS: Tracheostomy tube. PICC line noted. Enlarged cardiac silhouette. There is elevation of the RIGHT hemidiaphragm. Bilateral pleural effusions. Mild interstitial  edema. Low lung volumes. IMPRESSION: Extremely low lung volumes. Bilateral pleural effusions. Mild interstitial edema. Electronically Signed   By: Genevive Bi M.D.   On: 12/16/2018 14:46   US Thoracentesis Asp Pleural Space W/img Guide  Result Date: 12/28/2018 Kieth Brightly     12/28/2018  1:54 PM PROCEDURE SUMMARY: Successful image-guided left thoracentesis. Yielded 550 milliliters of clear yellow fluid. Patient tolerated procedure well. EBL: Zero No immediate complications. Specimen was not sent for labs. Post  procedure CXR shows no pneumothorax on the left side - however there is right sided pneumothorax with tension component noted. No procedure was performed on the right side today. Discussed with ordering provider who states they do not have anyone who can place a bedside chest tube - patient will be brought to IR for chest tube placement. Please see imaging section of Epic for full dictation. Villa Herb PA-C 12/28/2018 12:02 PM   US Thoracentesis Asp Pleural Space W/img Guide  Result Date: 12/23/2018 INDICATION: Shortness of breath. Left-sided pleural effusion. Request for diagnostic and therapeutic thoracentesis. EXAM: ULTRASOUND GUIDED LEFT THORACENTESIS MEDICATIONS: None. COMPLICATIONS: None immediate. PROCEDURE: An ultrasound guided thoracentesis was thoroughly discussed with the patient and questions answered. The benefits, risks, alternatives and complications were also discussed. The patient understands and wishes to proceed with the procedure. Written consent was obtained. Ultrasound was performed to localize and mark an adequate pocket of fluid in the left chest. The area was then prepped and draped in the normal sterile fashion. 1% Lidocaine was used for local anesthesia. Under ultrasound guidance a 6 Fr Safe-T-Centesis catheter was introduced. Thoracentesis was performed. The catheter was removed and a dressing applied. FINDINGS: A total of approximately 930 mL of clear pale yellow fluid was removed. Samples were sent to the laboratory as requested by the clinical team. IMPRESSION: Successful ultrasound guided left thoracentesis yielding 930 mL of pleural fluid. Read by: Brayton El PA-C Electronically Signed   By: Richarda Overlie M.D.   On: 12/23/2018 13:26    Labs:  CBC: Recent Labs    12/22/18 2233 12/24/18 0624 12/25/18 0445 12/28/18 0458  WBC 13.3* 11.0* 12.7* 12.1*  HGB 9.9* 10.5* 9.4* 9.4*  HCT 31.0* 32.8* 29.2* 29.9*  PLT 296 335 293 289    COAGS: Recent Labs     12/16/18 0040 12/24/18 0624 12/25/18 0445  INR 1.24 1.35 1.53  APTT 146*  --   --     BMP: Recent Labs    12/24/18 0624 12/25/18 0445 12/26/18 0525 12/27/18 0605 12/28/18 0458  NA 139 139 138  --  137  K 3.4* 3.6 3.4* 3.4* 3.7  CL 99 102 101  --  100  CO2 32 29 30  --  28  GLUCOSE 73 70 88  --  79  BUN 8 5* 5*  --  7*  CALCIUM 7.9* 7.6* 7.6*  --  7.7*  CREATININE 0.67 0.53* 0.49*  --  0.70  GFRNONAA >60 >60 >60  --  >60  GFRAA >60 >60 >60  --  >60    LIVER FUNCTION TESTS: Recent Labs    12/17/18 0802 12/23/18 1302  BILITOT 0.9  --   AST 13*  --   ALT 20  --   ALKPHOS 50  --   PROT 4.5* 5.8*  ALBUMIN 1.9*  --     TUMOR MARKERS: No results for input(s): AFPTM, CEA, CA199, CHROMGRNA in the last 8760 hours.  Assessment and Plan:  Right sided pneumothorax with  tension component seen on follow up CXR today following LEFT sided thoracentesis yielding 550 mL fluid. Per [facility] provider there is no one who can place a chest tube at bedside - discussed with Dr. Bonnielee Haff who agrees to place right sided chest tube today.  Risks and benefits discussed with the patient and his wife including bleeding, infection, damage to adjacent structures, malfunction of the catheter with need for additional procedures.  All of the patient and his wife's questions were answered, patient is agreeable to proceed. Consent signed and in chart.  Thank you for this interesting consult.  I greatly enjoyed meeting Madsen Riddle and look forward to participating in their care.  A copy of this report was sent to the requesting provider on this date.  Electronically Signed: Villa Herb, PA-C 12/28/2018, 1:57 PM   I spent a total of 20 Minutes  in face to face in clinical consultation, greater than 50% of which was counseling/coordinating care for right chest tube placement.

## 2018-12-29 ENCOUNTER — Other Ambulatory Visit (HOSPITAL_COMMUNITY): Payer: Self-pay

## 2018-12-29 DIAGNOSIS — I482 Chronic atrial fibrillation, unspecified: Secondary | ICD-10-CM | POA: Diagnosis not present

## 2018-12-29 DIAGNOSIS — F1019 Alcohol abuse with unspecified alcohol-induced disorder: Secondary | ICD-10-CM | POA: Diagnosis not present

## 2018-12-29 DIAGNOSIS — J9811 Atelectasis: Secondary | ICD-10-CM | POA: Diagnosis not present

## 2018-12-29 DIAGNOSIS — J9621 Acute and chronic respiratory failure with hypoxia: Secondary | ICD-10-CM | POA: Diagnosis not present

## 2018-12-29 LAB — POTASSIUM: Potassium: 2.3 mmol/L — CL (ref 3.5–5.1)

## 2018-12-29 LAB — MAGNESIUM: Magnesium: 1.6 mg/dL — ABNORMAL LOW (ref 1.7–2.4)

## 2018-12-29 NOTE — Progress Notes (Signed)
S/p (R)chest tube for PTX. Pt resting in bed. Chest tube has not been connected to suction since arriving back to room. D/w RN, need to connect to suction. Will order CXR to follow up after has been on suction for a few hours.  Brayton ElKevin Dorisann Schwanke PA-C Interventional Radiology 12/29/2018 10:03 AM

## 2018-12-29 NOTE — Progress Notes (Signed)
[facility] [place]   PULMONARY CRITICAL CARE SERVICE  PROGRESS NOTE  Date of Service: 12/29/2018  Steven CottaGerard Wong  ZOX:[id number]  DOB: 12/02/1951   DOA: 12/15/2018  Referring Physician: Carron CurieAli Hijazi, MD  HPI: Steven Wong is a 68 y.o. male seen for follow up of Acute on Chronic Respiratory Failure.  Patient right now is on full support did about 4 hours of pressure support wean and tolerated it well.  Right now is comfortable without distress.  Remains on assist control mode  Medications: Reviewed on Rounds  Physical Exam:  Vitals: Temperature 97.7 pulse 82 respiratory 15 blood pressure 95/77 saturations 95%  Ventilator Settings mode of ventilation assist control FiO2 40% tidal volume 400 PEEP 5  . General: Comfortable at this time . Eyes: Grossly normal lids, irises & conjunctiva . ENT: grossly tongue is normal . Neck: no obvious mass . Cardiovascular: S1 S2 normal no gallop . Respiratory: No rhonchi no rales . Abdomen: soft . Skin: no rash seen on limited exam . Musculoskeletal: not rigid . Psychiatric:unable to assess . Neurologic: no seizure no involuntary movements         Lab Data:   Basic Metabolic Panel: Recent Labs  Lab 12/22/18 2233  12/24/18 0624 12/25/18 0445 12/26/18 0525 12/27/18 0605 12/28/18 0458 12/29/18 0723  NA 138  --  139 139 138  --  137  --   K 3.7  --  3.4* 3.6 3.4* 3.4* 3.7 2.3*  CL 100  --  99 102 101  --  100  --   CO2 31  --  32 29 30  --  28  --   GLUCOSE 94  --  73 70 88  --  79  --   BUN 11  --  8 5* 5*  --  7*  --   CREATININE 0.78  --  0.67 0.53* 0.49*  --  0.70  --   CALCIUM 7.8*  --  7.9* 7.6* 7.6*  --  7.7*  --   MG  --    < > 1.4* 1.6* 1.6* 1.7 1.6* 1.6*  PHOS  --   --  3.6  --   --   --   --   --    < > = values in this interval not displayed.    ABG: Recent Labs  Lab 12/22/18 1452 12/22/18 2101 12/27/18 1215 12/27/18 1505 12/28/18 0443  PHART 7.473* 7.473* 7.485*  7.510* 7.516*  PCO2ART 39.2 40.9 39.9 37.8 38.3  PO2ART 46.5* 60.7* 53.1* 57.6* 115*  HCO3 28.4* 29.6* 29.7* 30.0* 30.7*  O2SAT 84.0 92.0 88.6 91.1 97.1    Liver Function Tests: Recent Labs  Lab 12/23/18 1302  PROT 5.8*   No results for input(s): LIPASE, AMYLASE in the last 168 hours. No results for input(s): AMMONIA in the last 168 hours.  CBC: Recent Labs  Lab 12/22/18 2233 12/24/18 0624 12/25/18 0445 12/28/18 0458  WBC 13.3* 11.0* 12.7* 12.1*  HGB 9.9* 10.5* 9.4* 9.4*  HCT 31.0* 32.8* 29.2* 29.9*  MCV 94.5 96.8 96.7 95.2  PLT 296 335 293 289    Cardiac Enzymes: No results for input(s): CKTOTAL, CKMB, CKMBINDEX, TROPONINI in the last 168 hours.  BNP (last 3 results) No results for input(s): BNP in the last 8760 hours.  ProBNP (last 3 results) No results for input(s): PROBNP in the last 8760 hours.  Radiological Exams: Dg Chest Port 1 View  Result Date: 12/29/2018 CLINICAL DATA:  Follow-up pneumothorax EXAM: PORTABLE CHEST 1 VIEW COMPARISON:  12/28/2018 FINDINGS: Cardiac shadows within normal limits. Tracheostomy tube and nasogastric catheter are noted in satisfactory position. Left-sided PICC line is seen coiled within the proximal superior vena cava. Right-sided chest tube is noted in satisfactory position. The previously seen pneumothorax has resolved. No sizable residual pneumothorax is seen on this exam. Small pleural effusion on the right is noted. IMPRESSION: Resolution of previously seen pneumothorax. Small right-sided pleural effusion remains. Electronically Signed   By: Alcide Clever M.D.   On: 12/29/2018 13:03   Dg Chest Port 1 View  Result Date: 12/28/2018 CLINICAL DATA:  Post left thoracentesis EXAM: PORTABLE CHEST 1 VIEW COMPARISON:  12/27/2018 FINDINGS: Decreasing left effusion following thoracentesis. No pneumothorax following thoracentesis. Cardiomegaly. Left lower lobe atelectasis or infiltrate. Small to moderate bilateral pleural effusions.  Moderate-sized right pneumothorax. No reported thoracentesis on the right side. Tracheostomy is unchanged. Left PICC line tip remains coiled in the left innominate vein. IMPRESSION: Decreasing left effusion following left thoracentesis. No pneumothorax on the left. There is moderate bilateral effusions and moderate-sized right pneumothorax. No reported right thoracentesis. Left lower lobe atelectasis or infiltrate. These results were called by telephone at the time of interpretation on 12/28/2018 at 12:22 pm to Dr. Lynnette Caffey , who verbally acknowledged these results. Electronically Signed   By: Charlett Nose M.D.   On: 12/28/2018 12:24   Dg Abd Portable 1v  Result Date: 12/28/2018 CLINICAL DATA:  Enteric tube placed EXAM: PORTABLE ABDOMEN - 1 VIEW COMPARISON:  None. FINDINGS: Enteric tube terminates in the proximal stomach. Surgical clips are noted in the left abdomen. Retained oral contrast throughout the colon. Mildly dilated small bowel loops in the visualized abdomen. No evidence of pneumatosis or pneumoperitoneum. Small left pleural effusion with left basilar lung opacity. IMPRESSION: Enteric tube terminates in the proximal stomach. Mildly dilated small bowel loops, can not exclude ileus or partial distal small bowel obstruction. Small left pleural effusion with left basilar lung opacity, favor atelectasis. Electronically Signed   By: Delbert Phenix M.D.   On: 12/28/2018 17:07   Ct Perc Pleural Drain W/indwell Cath W/img Guide  Result Date: 12/28/2018 INDICATION: Pneumothorax EXAM: RIGHT THORACOSTOMY MEDICATIONS: The patient is currently admitted to the hospital and receiving intravenous antibiotics. The antibiotics were administered within an appropriate time frame prior to the initiation of the procedure. ANESTHESIA/SEDATION: Fentanyl 75 mcg IV; Versed 1.5 mg IV Moderate Sedation Time:  13 minutes The patient was continuously monitored during the procedure by the interventional radiology nurse under  my direct supervision. COMPLICATIONS: None immediate. PROCEDURE: Informed written consent was obtained from the patient after a thorough discussion of the procedural risks, benefits and alternatives. All questions were addressed. Maximal Sterile Barrier Technique was utilized including caps, mask, sterile gowns, sterile gloves, sterile drape, hand hygiene and skin antiseptic. A timeout was performed prior to the initiation of the procedure. The right anterior chest was prepped and draped in a sterile fashion. 1% lidocaine was utilized for local anesthesia. Under CT guidance, an 18 gauge needle was advanced into the pleural space via mid axillary line first intracostal space approach. It was removed over an Amplatz wire. Twelve [place] dilator followed by a 12 [place] drain were inserted. It was looped and string fixed then sewn to the skin. It was attached to 20 cm water suction. FINDINGS: Imaging confirms placement of a 43 French thoracostomy at the right lung apex. IMPRESSION: Successful right 12 French chest tube placement for pneumothorax. Electronically Signed  By: Jolaine ClickArthur  Hoss M.D.   On: 12/28/2018 16:09   [place] Thoracentesis Asp Pleural Space W/img Guide  Result Date: 12/28/2018 Kieth BrightlyWatterson, Shannon A, PA-C     12/28/2018  1:54 PM PROCEDURE SUMMARY: Successful image-guided left thoracentesis. Yielded 550 milliliters of clear yellow fluid. Patient tolerated procedure well. EBL: Zero No immediate complications. Specimen was not sent for labs. Post procedure CXR shows no pneumothorax on the left side - however there is right sided pneumothorax with tension component noted. No procedure was performed on the right side today. Discussed with ordering provider who states they do not have anyone who can place a bedside chest tube - patient will be brought to IR for chest tube placement. Please see imaging section of Epic for full dictation. Villa HerbShannon A Watterson PA-C 12/28/2018 12:02 PM    Assessment/Plan Active  Problems:   Acute on chronic respiratory failure with hypoxia (HCC)   Severe sepsis (HCC)   Chronic atrial fibrillation   Chronic systolic heart failure (HCC)   Atelectasis of left lung   Alcohol abuse with unspecified alcohol-induced disorder (HCC)   Iatrogenic pneumothorax   1. Acute on chronic respiratory failure with hypoxia patient was able to wean 4 hours on pressure support mode patient is going to continue to advance the wean as tolerated on protocol. 2. Severe sepsis clinically improved resolved 3. Chronic atrial fibrillation rate is controlled 4. Chronic systolic heart failure at baseline we will continue to monitor 5. Atelectasis of the lung continue with aggressive pulmonary toilet patient had a thoracentesis done with drainage of fluid might require chest tube drainage such as Pleurx catheter. 6. Iatrogenic pneumothorax now is resolved has a chest tube in place 7. Alcohol abuse no active withdrawal   I have personally seen and evaluated the patient, evaluated laboratory and imaging results, formulated the assessment and plan and placed orders. The Patient requires high complexity decision making for assessment and support.  Case was discussed on Rounds with the Respiratory Therapy Staff  Yevonne PaxSaadat A Amorita Vanrossum, MD [facility]CC[facility] Pulmonary Critical Care Medicine Sleep Medicine

## 2018-12-30 ENCOUNTER — Other Ambulatory Visit (HOSPITAL_COMMUNITY): Payer: Self-pay

## 2018-12-30 DIAGNOSIS — J9811 Atelectasis: Secondary | ICD-10-CM | POA: Diagnosis not present

## 2018-12-30 DIAGNOSIS — I2699 Other pulmonary embolism without acute cor pulmonale: Secondary | ICD-10-CM | POA: Diagnosis not present

## 2018-12-30 DIAGNOSIS — Z9689 Presence of other specified functional implants: Secondary | ICD-10-CM

## 2018-12-30 DIAGNOSIS — F1019 Alcohol abuse with unspecified alcohol-induced disorder: Secondary | ICD-10-CM | POA: Diagnosis not present

## 2018-12-30 DIAGNOSIS — J939 Pneumothorax, unspecified: Secondary | ICD-10-CM

## 2018-12-30 DIAGNOSIS — J9621 Acute and chronic respiratory failure with hypoxia: Secondary | ICD-10-CM | POA: Diagnosis not present

## 2018-12-30 LAB — BASIC METABOLIC PANEL
Anion gap: 8 (ref 5–15)
Anion gap: 10 (ref 5–15)
BUN: 10 mg/dL (ref 8–23)
BUN: 10 mg/dL (ref 8–23)
CHLORIDE: 101 mmol/L (ref 98–111)
CHLORIDE: 99 mmol/L (ref 98–111)
CO2: 25 mmol/L (ref 22–32)
CO2: 26 mmol/L (ref 22–32)
Calcium: 7.4 mg/dL — ABNORMAL LOW (ref 8.9–10.3)
Calcium: 8 mg/dL — ABNORMAL LOW (ref 8.9–10.3)
Creatinine, Ser: 0.69 mg/dL (ref 0.61–1.24)
Creatinine, Ser: 0.68 mg/dL (ref 0.61–1.24)
GFR calc Af Amer: >60 mL/min (ref >60)
GFR calc non Af Amer: >60 mL/min (ref >60)
GFR calc non Af Amer: >60 mL/min (ref >60)
Glucose, Bld: 83 mg/dL (ref 70–99)
Glucose, Bld: 90 mg/dL (ref 70–99)
Potassium: 3.2 mmol/L — ABNORMAL LOW (ref 3.5–5.1)
Potassium: 2.9 mmol/L — ABNORMAL LOW (ref 3.5–5.1)
Sodium: 134 mmol/L — ABNORMAL LOW (ref 135–145)
Sodium: 135 mmol/L (ref 135–145)

## 2018-12-30 LAB — CBC WITH DIFFERENTIAL/PLATELET
Abs Immature Granulocytes: 0.12 10*3/uL — ABNORMAL HIGH (ref 0.00–0.07)
Basophils Absolute: 0 10*3/uL (ref 0.0–0.1)
Basophils Relative: 0 %
Eosinophils Absolute: 0 10*3/uL (ref 0.0–0.5)
Eosinophils Relative: 0 %
HCT: 35.2 % — ABNORMAL LOW (ref 39.0–52.0)
Hemoglobin: 11.4 g/dL — ABNORMAL LOW (ref 13.0–17.0)
Immature Granulocytes: 1 %
Lymphocytes Relative: 11 %
Lymphs Abs: 1.8 10*3/uL (ref 0.7–4.0)
MCH: 29.9 pg (ref 26.0–34.0)
MCHC: 32.4 g/dL (ref 30.0–36.0)
MCV: 92.4 fL (ref 80.0–100.0)
Monocytes Absolute: 0.8 10*3/uL (ref 0.1–1.0)
Monocytes Relative: 5 %
Neutro Abs: 13.9 10*3/uL — ABNORMAL HIGH (ref 1.7–7.7)
Neutrophils Relative %: 83 %
Platelets: 308 10*3/uL (ref 150–400)
RBC: 3.81 MIL/uL — ABNORMAL LOW (ref 4.22–5.81)
RDW: 16.4 % — ABNORMAL HIGH (ref 11.5–15.5)
WBC: 16.6 10*3/uL — ABNORMAL HIGH (ref 4.0–10.5)
nRBC: 0 % (ref 0.0–0.2)

## 2018-12-30 LAB — BLOOD GAS, ARTERIAL
ACID-BASE EXCESS: 5 mmol/L — AB (ref 0.0–2.0)
Acid-Base Excess: 5.2 mmol/L — ABNORMAL HIGH (ref 0.0–2.0)
BICARBONATE: 28.5 mmol/L — AB (ref 20.0–28.0)
Bicarbonate: 28.7 mmol/L — ABNORMAL HIGH (ref 20.0–28.0)
FIO2: 80
FIO2: 100
LHR: 16 {breaths}/min
O2 Saturation: 75.9 %
O2 Saturation: 91.3 %
PATIENT TEMPERATURE: 98.6
PEEP: 5 cmH2O
PEEP: 5 cmH2O
Patient temperature: 98.6
Pressure control: 22 cmH2O
Pressure control: 22 cmH2O
RATE: 16 resp/min
pCO2 arterial: 38.1 mmHg (ref 32.0–48.0)
pCO2 arterial: 38 mmHg (ref 32.0–48.0)
pH, Arterial: 7.489 — ABNORMAL HIGH (ref 7.350–7.450)
pH, Arterial: 7.488 — ABNORMAL HIGH (ref 7.350–7.450)
pO2, Arterial: 41.6 mmHg — ABNORMAL LOW (ref 83.0–108.0)
pO2, Arterial: 61.6 mmHg — ABNORMAL LOW (ref 83.0–108.0)

## 2018-12-30 LAB — MAGNESIUM
Magnesium: 1.8 mg/dL (ref 1.7–2.4)
Magnesium: 1.7 mg/dL (ref 1.7–2.4)

## 2018-12-30 MED ORDER — IOPAMIDOL (ISOVUE-370) INJECTION 76%
100.0000 mL | Freq: Once | INTRAVENOUS | Status: AC | PRN
  Administered 2018-12-30: 100 mL via INTRAVENOUS

## 2018-12-30 NOTE — Progress Notes (Signed)
Pulmonary Critical Care Medicine [facility] [place]   PULMONARY CRITICAL CARE SERVICE  PROGRESS NOTE  Date of Service: 12/30/2018  Daviel Hollenshead  WUJ:[id number]  DOB: 1951-07-04   DOA: 12/15/2018  Referring Physician: Carron Curie, MD  HPI: Steven Wong is a 68 y.o. male seen for follow up of Acute on Chronic Respiratory Failure.  This morning patient had sudden decline in his saturations.  He had a chest x-ray done earlier this morning which showed worsening of the aeration of the left lung.  Patient was bagged and lavaged follow-up chest x-ray showed improved aeration however currently patient is still on 100% FiO2.  Patient has multifactorial causes for the hypoxia including is bilateral pulmonary emboli as well as the pleural effusion that he had and also atelectasis.  He is already undergone bronchoscopy x1 since he has been here and he may be looking at needing bronchoscopy once again.  Spoke with the primary care team pulmonary emboli are obviously of concern as a cause of his hypoxia I am not sure that he is really a candidate for thrombolytics systemically because he is not hemodynamically compromised at was brought up that he might be a candidate for localized thrombolytics and the primary care team is going to speak to interventional radiology about this  Medications: Reviewed on Rounds  Physical Exam:  Vitals: Temperature 98.6 pulse 81 respiratory rate 18 blood pressure 113/63 saturations are 97%  Ventilator Settings currently mode of ventilation pressure assist control FiO2 100% tidal volume 392 PEEP of 5  . General: Comfortable at this time . Eyes: Grossly normal lids, irises & conjunctiva . ENT: grossly tongue is normal . Neck: no obvious mass . Cardiovascular: S1 S2 normal no gallop . Respiratory: Scattered rhonchi diminished at the bases . Abdomen: soft . Skin: no rash seen on limited exam . Musculoskeletal: not rigid . Psychiatric:unable to  assess . Neurologic: no seizure no involuntary movements         Lab Data:   Basic Metabolic Panel: Recent Labs  Lab 12/24/18 0624 12/25/18 0445 12/26/18 0525 12/27/18 0605 12/28/18 0458 12/29/18 0723 12/30/18 0612 12/30/18 1503  NA 139 139 138  --  137  --  134* 135  K 3.4* 3.6 3.4* 3.4* 3.7 2.3* 3.2* 2.9*  CL 99 102 101  --  100  --  101 99  CO2 32 29 30  --  28  --  25 26  GLUCOSE 73 70 88  --  79  --  83 90  BUN 8 5* 5*  --  7*  --  10 10  CREATININE 0.67 0.53* 0.49*  --  0.70  --  0.69 0.68  CALCIUM 7.9* 7.6* 7.6*  --  7.7*  --  7.4* 8.0*  MG 1.4* 1.6* 1.6* 1.7 1.6* 1.6* 1.8 1.7  PHOS 3.6  --   --   --   --   --   --   --     ABG: Recent Labs  Lab 12/27/18 1215 12/27/18 1505 12/28/18 0443 12/30/18 0925 12/30/18 1135  PHART 7.485* 7.510* 7.516* 7.489* 7.488*  PCO2ART 39.9 37.8 38.3 38.1 38.0  PO2ART 53.1* 57.6* 115* 41.6* 61.6*  HCO3 29.7* 30.0* 30.7* 28.7* 28.5*  O2SAT 88.6 91.1 97.1 75.9 91.3    Liver Function Tests: No results for input(s): AST, ALT, ALKPHOS, BILITOT, PROT, ALBUMIN in the last 168 hours. No results for input(s): LIPASE, AMYLASE in the last 168 hours. No results for input(s): AMMONIA in the last  168 hours.  CBC: Recent Labs  Lab 12/24/18 0624 12/25/18 0445 12/28/18 0458 12/30/18 1308  WBC 11.0* 12.7* 12.1* 16.6*  NEUTROABS  --   --   --  13.9*  HGB 10.5* 9.4* 9.4* 11.4*  HCT 32.8* 29.2* 29.9* 35.2*  MCV 96.8 96.7 95.2 92.4  PLT 335 293 289 308    Cardiac Enzymes: No results for input(s): CKTOTAL, CKMB, CKMBINDEX, TROPONINI in the last 168 hours.  BNP (last 3 results) No results for input(s): BNP in the last 8760 hours.  ProBNP (last 3 results) No results for input(s): PROBNP in the last 8760 hours.  Radiological Exams: Dg Chest Port 1 View  Result Date: 12/30/2018 CLINICAL DATA:  Followup pneumothorax EXAM: PORTABLE CHEST 1 VIEW COMPARISON:  Earlier same day FINDINGS: Tracheostomy and nasogastric tube remain in place.  Right chest tube remains in place. No visible pleural air. Small amount of pleural fluid at the lateral base. Worsened atelectasis at the left lung. Left arm PICC tip in the SVC or azygos vein. IMPRESSION: No pneumothorax seen on the right. Small amount of pleural fluid persists on the right. Worsened atelectasis/infiltrate of the left lung Electronically Signed   By: Paulina Fusi M.D.   On: 12/30/2018 08:12   Dg Chest Port 1 View  Result Date: 12/30/2018 CLINICAL DATA:  Respiratory distress from possible chest tube dislodgement. EXAM: PORTABLE CHEST 1 VIEW COMPARISON:  Earlier same day. FINDINGS: Tracheostomy tube in adequate position. Nasogastric tube courses off the inferior aspect of the film as tip is not visualized. Left-sided PICC line unchanged with tip over the region of the azygos vein. Right pigtail pleural drainage catheter unchanged. Lungs are adequately inflated demonstrate improvement in a moderate size left effusion likely with associated basilar atelectasis. Small amount right pleural fluid unchanged. No evidence of right pneumothorax. Cardiomediastinal silhouette and remainder of the exam is unchanged. IMPRESSION: Interval improvement in moderate size left effusion likely associated basilar atelectasis. Small amount right pleural fluid. Right-sided pigtail pleural drainage catheter unchanged. No pneumothorax. Remaining tubes and lines unchanged. Electronically Signed   By: Elberta Fortis M.D.   On: 12/30/2018 08:05   Dg Chest Port 1 View  Result Date: 12/29/2018 CLINICAL DATA:  Follow-up pneumothorax EXAM: PORTABLE CHEST 1 VIEW COMPARISON:  12/28/2018 FINDINGS: Cardiac shadows within normal limits. Tracheostomy tube and nasogastric catheter are noted in satisfactory position. Left-sided PICC line is seen coiled within the proximal superior vena cava. Right-sided chest tube is noted in satisfactory position. The previously seen pneumothorax has resolved. No sizable residual pneumothorax is seen  on this exam. Small pleural effusion on the right is noted. IMPRESSION: Resolution of previously seen pneumothorax. Small right-sided pleural effusion remains. Electronically Signed   By: Alcide Clever M.D.   On: 12/29/2018 13:03   Dg Abd Portable 1v  Result Date: 12/30/2018 CLINICAL DATA:  Follow-up ileus EXAM: PORTABLE ABDOMEN - 1 VIEW COMPARISON:  12/28/2018 FINDINGS: Nasogastric tube remains in the fundus of the stomach. Previously administered contrast remains within the colon. No dilated small bowel is seen. IMPRESSION: Contrast remains within the colon. No dilated small bowel is seen. Electronically Signed   By: Paulina Fusi M.D.   On: 12/30/2018 08:13   Dg Abd Portable 1v  Result Date: 12/28/2018 CLINICAL DATA:  Enteric tube placed EXAM: PORTABLE ABDOMEN - 1 VIEW COMPARISON:  None. FINDINGS: Enteric tube terminates in the proximal stomach. Surgical clips are noted in the left abdomen. Retained oral contrast throughout the colon. Mildly dilated small bowel loops in  the visualized abdomen. No evidence of pneumatosis or pneumoperitoneum. Small left pleural effusion with left basilar lung opacity. IMPRESSION: Enteric tube terminates in the proximal stomach. Mildly dilated small bowel loops, can not exclude ileus or partial distal small bowel obstruction. Small left pleural effusion with left basilar lung opacity, favor atelectasis. Electronically Signed   By: Delbert PhenixJason A Poff M.D.   On: 12/28/2018 17:07    Assessment/Plan Active Problems:   Acute on chronic respiratory failure with hypoxia (HCC)   Severe sepsis (HCC)   Chronic atrial fibrillation   Chronic systolic heart failure (HCC)   Atelectasis of left lung   Alcohol abuse with unspecified alcohol-induced disorder (HCC)   Iatrogenic pneumothorax   Bilateral pulmonary embolism (HCC)   1. Acute on chronic respiratory failure with hypoxia acute decompensation could represent mucous plugging in the setting of pulmonary emboli which caused his  oxygen to drop significantly.  Follow-up PO2 on the blood gas was 61 and he is showing some improvement after placing his right side down.  I have recommended also that we place him on Mucomyst for the mucous plugging and this will be done.  Bronchoscopy right now would be high risk because he is on 100% FiO2. 2. Pulmonary emboli bilateral he is on anticoagulation question is whether or not he might be a candidate for localized thrombolytics which will be discussed with interventional radiology. 3. Severe sepsis right now he appears to be hemodynamically stable and this is not the major issue right now 4. Chronic atrial fibrillation rate is controlled 5. Chronic systolic heart failure recommended that we do a follow-up echocardiogram to reassess the right-sided pressures as well as his left-sided systolic function. 6. Atelectasis of left lung secondary to mucous plugging needs aggressive pulmonary toilet he will be placed also on Mucomyst and bronchodilators. 7. Iatrogenic pneumothorax pigtail catheter is in place and is right lung is fully expanded now 8. Alcohol abuse nonissue at this time   I have personally seen and evaluated the patient, evaluated laboratory and imaging results, formulated the assessment and plan and placed orders.  Time spent 35 minutes patient is critically ill in danger of cardiac arrest and death discussed case with the treatment team and primary care team The Patient requires high complexity decision making for assessment and support.  Case was discussed on Rounds with the Respiratory Therapy Staff  Yevonne PaxSaadat A Rosalva Neary, MD [facility] Pulmonary Critical Care Medicine Sleep Medicine

## 2018-12-31 ENCOUNTER — Other Ambulatory Visit (HOSPITAL_COMMUNITY): Payer: Self-pay

## 2018-12-31 DIAGNOSIS — J9811 Atelectasis: Secondary | ICD-10-CM | POA: Diagnosis not present

## 2018-12-31 DIAGNOSIS — I2699 Other pulmonary embolism without acute cor pulmonale: Secondary | ICD-10-CM | POA: Diagnosis not present

## 2018-12-31 DIAGNOSIS — J9621 Acute and chronic respiratory failure with hypoxia: Secondary | ICD-10-CM | POA: Diagnosis not present

## 2018-12-31 DIAGNOSIS — F1019 Alcohol abuse with unspecified alcohol-induced disorder: Secondary | ICD-10-CM | POA: Diagnosis not present

## 2018-12-31 LAB — BLOOD GAS, ARTERIAL
Acid-Base Excess: 0.3 mmol/L (ref 0.0–2.0)
Bicarbonate: 23.3 mmol/L (ref 20.0–28.0)
FIO2: 80
O2 Saturation: 98.2 %
PCO2 ART: 31.1 mmHg — AB (ref 32.0–48.0)
PEEP: 8 cmH2O
Patient temperature: 98.6
Pressure control: 22 cmH2O
RATE: 16 resp/min
pH, Arterial: 7.487 — ABNORMAL HIGH (ref 7.350–7.450)
pO2, Arterial: 205 mmHg — ABNORMAL HIGH (ref 83.0–108.0)

## 2018-12-31 LAB — BASIC METABOLIC PANEL
Anion gap: 10 (ref 5–15)
BUN: 12 mg/dL (ref 8–23)
CO2: 26 mmol/L (ref 22–32)
Calcium: 7.5 mg/dL — ABNORMAL LOW (ref 8.9–10.3)
Chloride: 98 mmol/L (ref 98–111)
Creatinine, Ser: 0.7 mg/dL (ref 0.61–1.24)
GFR calc Af Amer: >60 mL/min (ref >60)
GFR calc non Af Amer: >60 mL/min (ref >60)
GLUCOSE: 107 mg/dL — AB (ref 70–99)
Potassium: 2.7 mmol/L — CL (ref 3.5–5.1)
Sodium: 134 mmol/L — ABNORMAL LOW (ref 135–145)

## 2018-12-31 LAB — CBC
HCT: 29.7 % — ABNORMAL LOW (ref 39.0–52.0)
Hemoglobin: 9.9 g/dL — ABNORMAL LOW (ref 13.0–17.0)
MCH: 31 pg (ref 26.0–34.0)
MCHC: 33.3 g/dL (ref 30.0–36.0)
MCV: 93.1 fL (ref 80.0–100.0)
Platelets: 278 10*3/uL (ref 150–400)
RBC: 3.19 MIL/uL — ABNORMAL LOW (ref 4.22–5.81)
RDW: 16.4 % — ABNORMAL HIGH (ref 11.5–15.5)
WBC: 18.7 10*3/uL — ABNORMAL HIGH (ref 4.0–10.5)
nRBC: 0 % (ref 0.0–0.2)

## 2018-12-31 LAB — MAGNESIUM: Magnesium: 1.6 mg/dL — ABNORMAL LOW (ref 1.7–2.4)

## 2018-12-31 NOTE — Progress Notes (Signed)
[facility]ulmonary Critical Care [facility]   PULMONARY CRITICAL CARE SERVICE  PROGRESS NOTE  Date of Service: 12/31/2018  Craige CottaGerard Petralia  [id number]  DOB: 09/28/1951   DOA: 12/15/2018  Referring Physician: Carron CurieAli Hijazi, MD  HPI: Craige CottaGerard Specht is a 68 y.o. male seen for follow up of Acute on Chronic Respiratory Failure.  Patient is back on the ventilator he actually had a CT scan done yesterday which actually shows improvement of his pulmonary embolism with no clots visualized.  This morning respiratory therapy did aggressive suctioning on him and also instilled Mucomyst which was able to clear up multiple mucous plugs.  Post Mucomyst the patient's oxygenation did improve and they were able to wean his FiO2 down to 80% he is still on a PEEP of 8.  I did also ask for a chest x-ray to be done.  Medications: Reviewed on Rounds  Physical Exam:  Vitals: Temperature 98.3 pulse 80 respiratory 24 blood pressure 105/64 saturations 100%  Ventilator Settings mode ventilation pressure assist control FiO2 80% tidal volume 450 PEEP 8  . General: Comfortable at this time . Eyes: Grossly normal lids, irises & conjunctiva . ENT: grossly tongue is normal . Neck: no obvious mass . Cardiovascular: S1 S2 normal no gallop . Respiratory: Coarse breath sounds diminished on the left . Abdomen: soft . Skin: no rash seen on limited exam . Musculoskeletal: not rigid . Psychiatric:unable to assess . Neurologic: no seizure no involuntary movements         Lab Data:   Basic Metabolic Panel: Recent Labs  Lab 12/26/18 0525  12/28/18 0458 12/29/18 0723 12/30/18 0612 12/30/18 1503 12/31/18 0623  NA 138  --  137  --  134* 135 134*  K 3.4*   < > 3.7 2.3* 3.2* 2.9* 2.7*  CL 101  --  100  --  101 99 98  CO2 30  --  28  --  25 26 26   GLUCOSE 88  --  79  --  83 90 107*  BUN 5*  --  7*  --  10 10 12   CREATININE 0.49*  --  0.70  --  0.69 0.68 0.70  CALCIUM 7.6*  --  7.7*  --  7.4*  8.0* 7.5*  MG 1.6*   < > 1.6* 1.6* 1.8 1.7 1.6*   < > = values in this interval not displayed.    ABG: Recent Labs  Lab 12/27/18 1505 12/28/18 0443 12/30/18 0925 12/30/18 1135 12/31/18 1000  PHART 7.510* 7.516* 7.489* 7.488* 7.487*  PCO2ART 37.8 38.3 38.1 38.0 31.1*  PO2ART 57.6* 115* 41.6* 61.6* 205*  HCO3 30.0* 30.7* 28.7* 28.5* 23.3  O2SAT 91.1 97.1 75.9 91.3 98.2    Liver Function Tests: No results for input(s): AST, ALT, ALKPHOS, BILITOT, PROT, ALBUMIN in the last 168 hours. No results for input(s): LIPASE, AMYLASE in the last 168 hours. No results for input(s): AMMONIA in the last 168 hours.  CBC: Recent Labs  Lab 12/25/18 0445 12/28/18 0458 12/30/18 1308 12/31/18 0623  WBC 12.7* 12.1* 16.6* 18.7*  NEUTROABS  --   --  13.9*  --   HGB 9.4* 9.4* 11.4* 9.9*  HCT 29.2* 29.9* 35.2* 29.7*  MCV 96.7 95.2 92.4 93.1  PLT 293 289 308 278    Cardiac Enzymes: No results for input(s): CKTOTAL, CKMB, CKMBINDEX, TROPONINI in the last 168 hours.  BNP (last 3 results) No results for input(s): BNP in the last 8760 hours.  ProBNP (last 3 results)  No results for input(s): PROBNP in the last 8760 hours.  Radiological Exams: Ct Angio Chest Pe W Or Wo Contrast  Result Date: 12/30/2018 CLINICAL DATA:  Acute onset of hypoxia. Assess for pulmonary embolus. EXAM: CT ANGIOGRAPHY CHEST WITH CONTRAST TECHNIQUE: Multidetector CT imaging of the chest was performed using the standard protocol during bolus administration of intravenous contrast. Multiplanar CT image reconstructions and MIPs were obtained to evaluate the vascular anatomy. CONTRAST:  ISOVUE-370 IOPAMIDOL (ISOVUE-370) INJECTION 76% COMPARISON:  CTA of the chest performed 12/22/2018, and chest radiograph performed earlier today at 7:41 a.m. FINDINGS: Cardiovascular: There is no evidence of significant pulmonary embolus. Evaluation for pulmonary embolus is suboptimal in areas of airspace consolidation. The heart is normal in  size. Diffuse coronary artery calcifications are seen. Scattered calcification is noted along the thoracic aorta and proximal great vessels. Mediastinum/Nodes: The patient's tracheostomy tube is seen ending 5 cm above the carina. The patient's enteric tube is seen extending to the jejunum, given the patient's gastrojejunal anastomosis. Prominence of the pulmonary arteries raises concern for pulmonary arterial hypertension. A small pericardial effusion is identified. Enlarged right paratracheal, precarinal and subcarinal nodes are seen, measuring up to 1.4 cm in short axis. The thyroid gland is unremarkable. No axillary lymphadenopathy is seen. Lungs/Pleura: A small left pleural effusion is noted, with complete consolidation of the left lower lobe. This is concerning for pneumonia. Mild patchy airspace opacities are seen within the remainder of both lungs, also reflecting pneumonia. No pneumothorax is seen. A right-sided chest tube is grossly unremarkable in appearance. Upper Abdomen: The liver and spleen are grossly unremarkable in appearance. The patient is status post cholecystectomy, with clips noted at the gallbladder fossa. The patient is status post gastric bypass surgery. The visualized portions of the adrenal glands and kidneys are grossly unremarkable. Musculoskeletal: No acute osseous abnormalities are identified. The visualized musculature is unremarkable in appearance. Review of the MIP images confirms the above findings. IMPRESSION: 1. No evidence of significant pulmonary embolus. 2. Small left pleural effusion, with complete consolidation of the left lower lung lobe. Additional patchy bilateral airspace opacities. Findings compatible with multifocal pneumonia. 3. Enlarged mediastinal nodes, measuring up to 1.4 cm in short axis. This could reflect the underlying infection. 4. Diffuse coronary artery calcifications seen. 5. Small pericardial effusion noted. 6. Prominence of the pulmonary arteries raises  concern for pulmonary arterial hypertension. 7. Status post gastric bypass surgery. Enteric tube noted extending to the jejunum. 8. Right-sided chest tube is unremarkable in appearance. No evidence of pneumothorax. Electronically Signed   By: Roanna Raider M.D.   On: 12/30/2018 21:47   Dg Chest Port 1 View  Result Date: 12/30/2018 CLINICAL DATA:  Followup pneumothorax EXAM: PORTABLE CHEST 1 VIEW COMPARISON:  Earlier same day FINDINGS: Tracheostomy and nasogastric tube remain in place. Right chest tube remains in place. No visible pleural air. Small amount of pleural fluid at the lateral base. Worsened atelectasis at the left lung. Left arm PICC tip in the SVC or azygos vein. IMPRESSION: No pneumothorax seen on the right. Small amount of pleural fluid persists on the right. Worsened atelectasis/infiltrate of the left lung Electronically Signed   By: Paulina Fusi M.D.   On: 12/30/2018 08:12   Dg Chest Port 1 View  Result Date: 12/30/2018 CLINICAL DATA:  Respiratory distress from possible chest tube dislodgement. EXAM: PORTABLE CHEST 1 VIEW COMPARISON:  Earlier same day. FINDINGS: Tracheostomy tube in adequate position. Nasogastric tube courses off the inferior aspect of the film as tip  is not visualized. Left-sided PICC line unchanged with tip over the region of the azygos vein. Right pigtail pleural drainage catheter unchanged. Lungs are adequately inflated demonstrate improvement in a moderate size left effusion likely with associated basilar atelectasis. Small amount right pleural fluid unchanged. No evidence of right pneumothorax. Cardiomediastinal silhouette and remainder of the exam is unchanged. IMPRESSION: Interval improvement in moderate size left effusion likely associated basilar atelectasis. Small amount right pleural fluid. Right-sided pigtail pleural drainage catheter unchanged. No pneumothorax. Remaining tubes and lines unchanged. Electronically Signed   By: Elberta Fortis M.D.   On: 12/30/2018  08:05   Dg Chest Port 1 View  Result Date: 12/29/2018 CLINICAL DATA:  Follow-up pneumothorax EXAM: PORTABLE CHEST 1 VIEW COMPARISON:  12/28/2018 FINDINGS: Cardiac shadows within normal limits. Tracheostomy tube and nasogastric catheter are noted in satisfactory position. Left-sided PICC line is seen coiled within the proximal superior vena cava. Right-sided chest tube is noted in satisfactory position. The previously seen pneumothorax has resolved. No sizable residual pneumothorax is seen on this exam. Small pleural effusion on the right is noted. IMPRESSION: Resolution of previously seen pneumothorax. Small right-sided pleural effusion remains. Electronically Signed   By: Alcide Clever M.D.   On: 12/29/2018 13:03   Dg Abd Portable 1v  Result Date: 12/30/2018 CLINICAL DATA:  Follow-up ileus EXAM: PORTABLE ABDOMEN - 1 VIEW COMPARISON:  12/28/2018 FINDINGS: Nasogastric tube remains in the fundus of the stomach. Previously administered contrast remains within the colon. No dilated small bowel is seen. IMPRESSION: Contrast remains within the colon. No dilated small bowel is seen. Electronically Signed   By: Paulina Fusi M.D.   On: 12/30/2018 08:13    Assessment/Plan Active Problems:   Acute on chronic respiratory failure with hypoxia (HCC)   Severe sepsis (HCC)   Chronic atrial fibrillation   Chronic systolic heart failure (HCC)   Atelectasis of left lung   Alcohol abuse with unspecified alcohol-induced disorder (HCC)   Iatrogenic pneumothorax   Bilateral pulmonary embolism (HCC)   1. Acute on chronic respiratory failure with hypoxia there is been an improvement in his oxygenation since the aggressive pulmonary toilet.  The patient had an ABG done which now shows a pH of 7.487 PCO2 31 PO2 205 and based on this ABG resulted asked for respiratory therapy to wean his oxygen down further.  Patient has a very tenuous pulmonary status because of ongoing mucous plugging so therefore we are going to continue  with Mucomyst and albuterol and also have recommended that he have chest PT done to keep mucus clear.  If this occurs again he might need to have a bronchoscopy. 2. Atelectasis of the left lung with recurrent mucous plugging as noted above needs aggressive pulmonary toilet will continue with the Mucomyst. 3. Severe sepsis resolved hemodynamically stable 4. Chronic atrial fibrillation rate controlled 5. Chronic systolic heart failure currently is compensated. 6. Iatrogenic pneumothorax resolved 7. Bilateral pulmonary emboli by the most recent CT scan appears to be resolved we will continue with anticoagulation as ordered. 8. Alcohol abuse at baseline we will continue with supportive care   I have personally seen and evaluated the patient, evaluated laboratory and imaging results, formulated the assessment and plan and placed orders.  Time spent 35 minutes patient has had acute change in status discussed with the primary care team and treatment team The Patient requires high complexity decision making for assessment and support.  Case was discussed on Rounds with the Respiratory Therapy Staff  Yevonne Pax, MD  [facility] Pulmonary Critical Care Medicine Sleep Medicine

## 2018-12-31 NOTE — Progress Notes (Signed)
Referring Physician(s): Dr Sharyon Medicus  Supervising Physician: Irish Lack  Patient Status:  Select IP  Chief Complaint:  Right tension PTX Chest tube placement 12/28/18  Subjective:  Chest tube intact Site is clean and dry and NT No air leak Agitated this am  CT yesterday:  IMPRESSION: 1. No evidence of significant pulmonary embolus. 2. Small left pleural effusion, with complete consolidation of the left lower lung lobe. Additional patchy bilateral airspace opacities. Findings compatible with multifocal pneumonia. 3. Enlarged mediastinal nodes, measuring up to 1.4 cm in short axis. This could reflect the underlying infection. 4. Diffuse coronary artery calcifications seen. 5. Small pericardial effusion noted. 6. Prominence of the pulmonary arteries raises concern for pulmonary arterial hypertension. 7. Status post gastric bypass surgery. Enteric tube noted extending to the jejunum. 8. Right-sided chest tube is unremarkable in appearance. No evidence of pneumothorax.  Allergies: Patient has no allergy information on record.  Medications: Prior to Admission medications   Not on File     Vital Signs: BP (!) 149/81   Pulse (!) 106   Resp (!) 25   SpO2 94%   Physical Exam Vitals signs reviewed.  Pulmonary:     Breath sounds: Wheezing present.  Skin:    General: Skin is warm and dry.     Comments: Rt chest tube in place 100 cc serous fluid in chamber No air leak  Site is clean and dry NT No bleeding  Neurological:     Mental Status: He is alert.  Psychiatric:     Comments: Seems some agitated today     Imaging: Ct Angio Chest Pe W Or Wo Contrast  Result Date: 12/30/2018 CLINICAL DATA:  Acute onset of hypoxia. Assess for pulmonary embolus. EXAM: CT ANGIOGRAPHY CHEST WITH CONTRAST TECHNIQUE: Multidetector CT imaging of the chest was performed using the standard protocol during bolus administration of intravenous contrast. Multiplanar CT image  reconstructions and MIPs were obtained to evaluate the vascular anatomy. CONTRAST:  ISOVUE-370 IOPAMIDOL (ISOVUE-370) INJECTION 76% COMPARISON:  CTA of the chest performed 12/22/2018, and chest radiograph performed earlier today at 7:41 a.m. FINDINGS: Cardiovascular: There is no evidence of significant pulmonary embolus. Evaluation for pulmonary embolus is suboptimal in areas of airspace consolidation. The heart is normal in size. Diffuse coronary artery calcifications are seen. Scattered calcification is noted along the thoracic aorta and proximal great vessels. Mediastinum/Nodes: The patient's tracheostomy tube is seen ending 5 cm above the carina. The patient's enteric tube is seen extending to the jejunum, given the patient's gastrojejunal anastomosis. Prominence of the pulmonary arteries raises concern for pulmonary arterial hypertension. A small pericardial effusion is identified. Enlarged right paratracheal, precarinal and subcarinal nodes are seen, measuring up to 1.4 cm in short axis. The thyroid gland is unremarkable. No axillary lymphadenopathy is seen. Lungs/Pleura: A small left pleural effusion is noted, with complete consolidation of the left lower lobe. This is concerning for pneumonia. Mild patchy airspace opacities are seen within the remainder of both lungs, also reflecting pneumonia. No pneumothorax is seen. A right-sided chest tube is grossly unremarkable in appearance. Upper Abdomen: The liver and spleen are grossly unremarkable in appearance. The patient is status post cholecystectomy, with clips noted at the gallbladder fossa. The patient is status post gastric bypass surgery. The visualized portions of the adrenal glands and kidneys are grossly unremarkable. Musculoskeletal: No acute osseous abnormalities are identified. The visualized musculature is unremarkable in appearance. Review of the MIP images confirms the above findings. IMPRESSION: 1. No evidence of  significant pulmonary  embolus. 2. Small left pleural effusion, with complete consolidation of the left lower lung lobe. Additional patchy bilateral airspace opacities. Findings compatible with multifocal pneumonia. 3. Enlarged mediastinal nodes, measuring up to 1.4 cm in short axis. This could reflect the underlying infection. 4. Diffuse coronary artery calcifications seen. 5. Small pericardial effusion noted. 6. Prominence of the pulmonary arteries raises concern for pulmonary arterial hypertension. 7. Status post gastric bypass surgery. Enteric tube noted extending to the jejunum. 8. Right-sided chest tube is unremarkable in appearance. No evidence of pneumothorax. Electronically Signed   By: Roanna Raider M.D.   On: 12/30/2018 21:47   Dg Chest Port 1 View  Result Date: 12/30/2018 CLINICAL DATA:  Followup pneumothorax EXAM: PORTABLE CHEST 1 VIEW COMPARISON:  Earlier same day FINDINGS: Tracheostomy and nasogastric tube remain in place. Right chest tube remains in place. No visible pleural air. Small amount of pleural fluid at the lateral base. Worsened atelectasis at the left lung. Left arm PICC tip in the SVC or azygos vein. IMPRESSION: No pneumothorax seen on the right. Small amount of pleural fluid persists on the right. Worsened atelectasis/infiltrate of the left lung Electronically Signed   By: Paulina Fusi M.D.   On: 12/30/2018 08:12   Dg Chest Port 1 View  Result Date: 12/30/2018 CLINICAL DATA:  Respiratory distress from possible chest tube dislodgement. EXAM: PORTABLE CHEST 1 VIEW COMPARISON:  Earlier same day. FINDINGS: Tracheostomy tube in adequate position. Nasogastric tube courses off the inferior aspect of the film as tip is not visualized. Left-sided PICC line unchanged with tip over the region of the azygos vein. Right pigtail pleural drainage catheter unchanged. Lungs are adequately inflated demonstrate improvement in a moderate size left effusion likely with associated basilar atelectasis. Small amount right  pleural fluid unchanged. No evidence of right pneumothorax. Cardiomediastinal silhouette and remainder of the exam is unchanged. IMPRESSION: Interval improvement in moderate size left effusion likely associated basilar atelectasis. Small amount right pleural fluid. Right-sided pigtail pleural drainage catheter unchanged. No pneumothorax. Remaining tubes and lines unchanged. Electronically Signed   By: Elberta Fortis M.D.   On: 12/30/2018 08:05   Dg Chest Port 1 View  Result Date: 12/29/2018 CLINICAL DATA:  Follow-up pneumothorax EXAM: PORTABLE CHEST 1 VIEW COMPARISON:  12/28/2018 FINDINGS: Cardiac shadows within normal limits. Tracheostomy tube and nasogastric catheter are noted in satisfactory position. Left-sided PICC line is seen coiled within the proximal superior vena cava. Right-sided chest tube is noted in satisfactory position. The previously seen pneumothorax has resolved. No sizable residual pneumothorax is seen on this exam. Small pleural effusion on the right is noted. IMPRESSION: Resolution of previously seen pneumothorax. Small right-sided pleural effusion remains. Electronically Signed   By: Alcide Clever M.D.   On: 12/29/2018 13:03   Dg Chest Port 1 View  Result Date: 12/28/2018 CLINICAL DATA:  Post left thoracentesis EXAM: PORTABLE CHEST 1 VIEW COMPARISON:  12/27/2018 FINDINGS: Decreasing left effusion following thoracentesis. No pneumothorax following thoracentesis. Cardiomegaly. Left lower lobe atelectasis or infiltrate. Small to moderate bilateral pleural effusions. Moderate-sized right pneumothorax. No reported thoracentesis on the right side. Tracheostomy is unchanged. Left PICC line tip remains coiled in the left innominate vein. IMPRESSION: Decreasing left effusion following left thoracentesis. No pneumothorax on the left. There is moderate bilateral effusions and moderate-sized right pneumothorax. No reported right thoracentesis. Left lower lobe atelectasis or infiltrate. These results  were called by telephone at the time of interpretation on 12/28/2018 at 12:22 pm to Dr. Lynnette Caffey , who  verbally acknowledged these results. Electronically Signed   By: Charlett NoseKevin  Dover M.D.   On: 12/28/2018 12:24   Dg Chest Port 1 View  Result Date: 12/27/2018 CLINICAL DATA:  Chronic ventilator dependent respiratory failure. Sepsis. Acute BILATERAL pulmonary emboli. Follow-up BILATERAL pleural effusions and associated BILATERAL atelectasis and/or pneumonia, LEFT greater than RIGHT. EXAM: PORTABLE CHEST 1 VIEW COMPARISON:  12/25/2018, 12/23/2018, 12/22/2018, 12/16/2018 and CTA chest 12/22/2018. FINDINGS: Tracheostomy tube tip in satisfactory position below the thoracic inlet, unchanged. LEFT arm PICC looped back upon itself distally at the level of the proximal SVC. Since the examination 2 days ago, further increase in size of the large LEFT pleural effusion. Stable dense consolidation in the LEFT LOWER LOBE and lingula. No change in the small to moderate-sized RIGHT pleural effusion and mild atelectasis at the RIGHT lung base. Lungs otherwise clear. Pulmonary vascularity normal. IMPRESSION: 1. Further increase in size of the large LEFT pleural effusion since the examination 2 days ago. 2. Stable dense atelectasis and/or pneumonia in the LEFT LOWER LOBE and lingula. 3. Stable small to moderate-sized RIGHT pleural effusion and mild RIGHT basilar atelectasis. 4. LEFT arm PICC tip may be in the azygos vein. If there are difficulties with use of the PICC, withdrawing the catheter approximately 4-5 cm would result in positioning within the innominate vein. Electronically Signed   By: Hulan Saashomas  Lawrence M.D.   On: 12/27/2018 12:57   Dg Abd Portable 1v  Result Date: 12/30/2018 CLINICAL DATA:  Follow-up ileus EXAM: PORTABLE ABDOMEN - 1 VIEW COMPARISON:  12/28/2018 FINDINGS: Nasogastric tube remains in the fundus of the stomach. Previously administered contrast remains within the colon. No dilated small bowel is seen.  IMPRESSION: Contrast remains within the colon. No dilated small bowel is seen. Electronically Signed   By: Paulina FusiMark  Shogry M.D.   On: 12/30/2018 08:13   Dg Abd Portable 1v  Result Date: 12/28/2018 CLINICAL DATA:  Enteric tube placed EXAM: PORTABLE ABDOMEN - 1 VIEW COMPARISON:  None. FINDINGS: Enteric tube terminates in the proximal stomach. Surgical clips are noted in the left abdomen. Retained oral contrast throughout the colon. Mildly dilated small bowel loops in the visualized abdomen. No evidence of pneumatosis or pneumoperitoneum. Small left pleural effusion with left basilar lung opacity. IMPRESSION: Enteric tube terminates in the proximal stomach. Mildly dilated small bowel loops, can not exclude ileus or partial distal small bowel obstruction. Small left pleural effusion with left basilar lung opacity, favor atelectasis. Electronically Signed   By: Delbert PhenixJason A Poff M.D.   On: 12/28/2018 17:07   Ct Perc Pleural Drain W/indwell Cath W/img Guide  Result Date: 12/28/2018 INDICATION: Pneumothorax EXAM: RIGHT THORACOSTOMY MEDICATIONS: The patient is currently admitted to the hospital and receiving intravenous antibiotics. The antibiotics were administered within an appropriate time frame prior to the initiation of the procedure. ANESTHESIA/SEDATION: Fentanyl 75 mcg IV; Versed 1.5 mg IV Moderate Sedation Time:  13 minutes The patient was continuously monitored during the procedure by the interventional radiology nurse under my direct supervision. COMPLICATIONS: None immediate. PROCEDURE: Informed written consent was obtained from the patient after a thorough discussion of the procedural risks, benefits and alternatives. All questions were addressed. Maximal Sterile Barrier Technique was utilized including caps, mask, sterile gowns, sterile gloves, sterile drape, hand hygiene and skin antiseptic. A timeout was performed prior to the initiation of the procedure. The right anterior chest was prepped and draped in a  sterile fashion. 1% lidocaine was utilized for local anesthesia. Under CT guidance, an 18 gauge needle was advanced  into the pleural space via mid axillary line first intracostal space approach. It was removed over an Amplatz wire. Twelve JamaicaFrench dilator followed by a 12 [place] drain were inserted. It was looped and string fixed then sewn to the skin. It was attached to 20 cm water suction. FINDINGS: Imaging confirms placement of a 6512 French thoracostomy at the right lung apex. IMPRESSION: Successful right 12 French chest tube placement for pneumothorax. Electronically Signed   By: Jolaine ClickArthur  Hoss M.D.   On: 12/28/2018 16:09   [place] Thoracentesis Asp Pleural Space W/img Guide  Result Date: 12/28/2018 Kieth BrightlyWatterson, Shannon A, PA-C     12/28/2018  1:54 PM PROCEDURE SUMMARY: Successful image-guided left thoracentesis. Yielded 550 milliliters of clear yellow fluid. Patient tolerated procedure well. EBL: Zero No immediate complications. Specimen was not sent for labs. Post procedure CXR shows no pneumothorax on the left side - however there is right sided pneumothorax with tension component noted. No procedure was performed on the right side today. Discussed with ordering provider who states they do not have anyone who can place a bedside chest tube - patient will be brought to IR for chest tube placement. Please see imaging section of Epic for full dictation. Villa HerbShannon A Watterson PA-C 12/28/2018 12:02 PM    Labs:  CBC: Recent Labs    12/25/18 0445 12/28/18 0458 12/30/18 1308 12/31/18 0623  WBC 12.7* 12.1* 16.6* 18.7*  HGB 9.4* 9.4* 11.4* 9.9*  HCT 29.2* 29.9* 35.2* 29.7*  PLT 293 289 308 278    COAGS: Recent Labs    12/16/18 0040 12/24/18 0624 12/25/18 0445  INR 1.24 1.35 1.53  APTT 146*  --   --     BMP: Recent Labs    12/28/18 0458 12/29/18 0723 12/30/18 0612 12/30/18 1503 12/31/18 0623  NA 137  --  134* 135 134*  K 3.7 2.3* 3.2* 2.9* 2.7*  CL 100  --  101 99 98  CO2 28  --  25 26 26     GLUCOSE 79  --  83 90 107*  BUN 7*  --  10 10 12   CALCIUM 7.7*  --  7.4* 8.0* 7.5*  CREATININE 0.70  --  0.69 0.68 0.70  GFRNONAA >60  --  >60 >60 >60  GFRAA >60  --  >60 >60 >60    LIVER FUNCTION TESTS: Recent Labs    12/17/18 0802 12/23/18 1302  BILITOT 0.9  --   AST 13*  --   ALT 20  --   ALKPHOS 50  --   PROT 4.5* 5.8*  ALBUMIN 1.9*  --     Assessment and Plan:  Right chest tube in place Will follow  Electronically Signed: Robet LeuPamela A Latanja Lehenbauer, PA-C 12/31/2018, 10:03 AM   I spent a total of 15 Minutes at the the patient's bedside AND on the patient's hospital floor or unit, greater than 50% of which was counseling/coordinating care for right chest tube

## 2019-01-01 ENCOUNTER — Ambulatory Visit (HOSPITAL_COMMUNITY): Payer: Medicare PPO | Attending: General Practice

## 2019-01-01 DIAGNOSIS — I4891 Unspecified atrial fibrillation: Secondary | ICD-10-CM | POA: Diagnosis not present

## 2019-01-01 DIAGNOSIS — J9621 Acute and chronic respiratory failure with hypoxia: Secondary | ICD-10-CM | POA: Diagnosis not present

## 2019-01-01 DIAGNOSIS — F1019 Alcohol abuse with unspecified alcohol-induced disorder: Secondary | ICD-10-CM | POA: Diagnosis not present

## 2019-01-01 DIAGNOSIS — J9811 Atelectasis: Secondary | ICD-10-CM | POA: Diagnosis not present

## 2019-01-01 DIAGNOSIS — I482 Chronic atrial fibrillation, unspecified: Secondary | ICD-10-CM | POA: Diagnosis not present

## 2019-01-01 DIAGNOSIS — I5022 Chronic systolic (congestive) heart failure: Secondary | ICD-10-CM

## 2019-01-01 DIAGNOSIS — I313 Pericardial effusion (noninflammatory): Secondary | ICD-10-CM | POA: Insufficient documentation

## 2019-01-01 LAB — COMPREHENSIVE METABOLIC PANEL
ALT: 13 U/L (ref 0–44)
AST: 21 U/L (ref 15–41)
Albumin: 1.6 g/dL — ABNORMAL LOW (ref 3.5–5.0)
Alkaline Phosphatase: 49 U/L (ref 38–126)
Anion gap: 9 (ref 5–15)
BUN: 25 mg/dL — ABNORMAL HIGH (ref 8–23)
CO2: 22 mmol/L (ref 22–32)
Calcium: 7.7 mg/dL — ABNORMAL LOW (ref 8.9–10.3)
Chloride: 100 mmol/L (ref 98–111)
Creatinine, Ser: 0.91 mg/dL (ref 0.61–1.24)
GFR calc Af Amer: >60 mL/min (ref >60)
GFR calc non Af Amer: >60 mL/min (ref >60)
Glucose, Bld: 137 mg/dL — ABNORMAL HIGH (ref 70–99)
Potassium: 4.7 mmol/L (ref 3.5–5.1)
Sodium: 131 mmol/L — ABNORMAL LOW (ref 135–145)
Total Bilirubin: 0.6 mg/dL (ref 0.3–1.2)
Total Protein: 4.5 g/dL — ABNORMAL LOW (ref 6.5–8.1)

## 2019-01-01 LAB — CBC
HCT: 26.8 % — ABNORMAL LOW (ref 39.0–52.0)
Hemoglobin: 8.7 g/dL — ABNORMAL LOW (ref 13.0–17.0)
MCH: 31.4 pg (ref 26.0–34.0)
MCHC: 32.5 g/dL (ref 30.0–36.0)
MCV: 96.8 fL (ref 80.0–100.0)
NRBC: 0 % (ref 0.0–0.2)
Platelets: 263 10*3/uL (ref 150–400)
RBC: 2.77 MIL/uL — ABNORMAL LOW (ref 4.22–5.81)
RDW: 17.2 % — ABNORMAL HIGH (ref 11.5–15.5)
WBC: 13.2 10*3/uL — ABNORMAL HIGH (ref 4.0–10.5)

## 2019-01-01 LAB — PROTIME-INR
INR: 1.37
Prothrombin Time: 16.7 seconds — ABNORMAL HIGH (ref 11.4–15.2)

## 2019-01-01 LAB — MAGNESIUM: Magnesium: 2.2 mg/dL (ref 1.7–2.4)

## 2019-01-01 LAB — ECHOCARDIOGRAM COMPLETE

## 2019-01-01 NOTE — Progress Notes (Signed)
  Echocardiogram 2D Echocardiogram has been performed.  Ayiana Winslett T Tijana Walder 01/01/2019, 12:14 PM

## 2019-01-01 NOTE — Progress Notes (Signed)
Pulmonary Critical Care Medicine [facility] [place]   PULMONARY CRITICAL CARE SERVICE  PROGRESS NOTE  Date of Service: 01/01/2019  Steven Wong  ITG:[id number]  DOB: 1951-10-10   DOA: 12/15/2018  Referring Physician: Carron Curie, MD  HPI: Steven Wong is a 68 y.o. male seen for follow up of Acute on Chronic Respiratory Failure.  Patient is doing a little bit better at this time has still been on pressure control mode.  His oxygen was decreased down to 50% with good volumes now.  Chest x-ray was reviewed and shows market improvement.  Medications: Reviewed on Rounds  Physical Exam:  Vitals: Temperature 97.4 pulse 60 respiratory 13 blood pressure 111/78 saturations 100%  Ventilator Settings mode ventilation pressure assist control FiO2 50% inspiratory pressure 18 PEEP 5 tidal volume 400  . General: Comfortable at this time . Eyes: Grossly normal lids, irises & conjunctiva . ENT: grossly tongue is normal . Neck: no obvious mass . Cardiovascular: S1 S2 normal no gallop . Respiratory: Coarse rhonchi expansion is equal . Abdomen: soft . Skin: no rash seen on limited exam . Musculoskeletal: not rigid . Psychiatric:unable to assess . Neurologic: no seizure no involuntary movements         Lab Data:   Basic Metabolic Panel: Recent Labs  Lab 12/28/18 0458 12/29/18 0723 12/30/18 0612 12/30/18 1503 12/31/18 0623 01/01/19 0804  NA 137  --  134* 135 134* 131*  K 3.7 2.3* 3.2* 2.9* 2.7* 4.7  CL 100  --  101 99 98 100  CO2 28  --  25 26 26 22   GLUCOSE 79  --  83 90 107* 137*  BUN 7*  --  10 10 12  25*  CREATININE 0.70  --  0.69 0.68 0.70 0.91  CALCIUM 7.7*  --  7.4* 8.0* 7.5* 7.7*  MG 1.6* 1.6* 1.8 1.7 1.6* 2.2    ABG: Recent Labs  Lab 12/27/18 1505 12/28/18 0443 12/30/18 0925 12/30/18 1135 12/31/18 1000  PHART 7.510* 7.516* 7.489* 7.488* 7.487*  PCO2ART 37.8 38.3 38.1 38.0 31.1*  PO2ART 57.6* 115* 41.6* 61.6* 205*  HCO3 30.0* 30.7* 28.7* 28.5* 23.3   O2SAT 91.1 97.1 75.9 91.3 98.2    Liver Function Tests: Recent Labs  Lab 01/01/19 0804  AST 21  ALT 13  ALKPHOS 49  BILITOT 0.6  PROT 4.5*  ALBUMIN 1.6*   No results for input(s): LIPASE, AMYLASE in the last 168 hours. No results for input(s): AMMONIA in the last 168 hours.  CBC: Recent Labs  Lab 12/28/18 0458 12/30/18 1308 12/31/18 0623 01/01/19 0804  WBC 12.1* 16.6* 18.7* 13.2*  NEUTROABS  --  13.9*  --   --   HGB 9.4* 11.4* 9.9* 8.7*  HCT 29.9* 35.2* 29.7* 26.8*  MCV 95.2 92.4 93.1 96.8  PLT 289 308 278 263    Cardiac Enzymes: No results for input(s): CKTOTAL, CKMB, CKMBINDEX, TROPONINI in the last 168 hours.  BNP (last 3 results) No results for input(s): BNP in the last 8760 hours.  ProBNP (last 3 results) No results for input(s): PROBNP in the last 8760 hours.  Radiological Exams: Ct Angio Chest Pe W Or Wo Contrast  Result Date: 12/30/2018 CLINICAL DATA:  Acute onset of hypoxia. Assess for pulmonary embolus. EXAM: CT ANGIOGRAPHY CHEST WITH CONTRAST TECHNIQUE: Multidetector CT imaging of the chest was performed using the standard protocol during bolus administration of intravenous contrast. Multiplanar CT image reconstructions and MIPs were obtained to evaluate the vascular anatomy. CONTRAST:  ISOVUE-370 IOPAMIDOL (  ISOVUE-370) INJECTION 76% COMPARISON:  CTA of the chest performed 12/22/2018, and chest radiograph performed earlier today at 7:41 a.m. FINDINGS: Cardiovascular: There is no evidence of significant pulmonary embolus. Evaluation for pulmonary embolus is suboptimal in areas of airspace consolidation. The heart is normal in size. Diffuse coronary artery calcifications are seen. Scattered calcification is noted along the thoracic aorta and proximal great vessels. Mediastinum/Nodes: The patient's tracheostomy tube is seen ending 5 cm above the carina. The patient's enteric tube is seen extending to the jejunum, given the patient's gastrojejunal  anastomosis. Prominence of the pulmonary arteries raises concern for pulmonary arterial hypertension. A small pericardial effusion is identified. Enlarged right paratracheal, precarinal and subcarinal nodes are seen, measuring up to 1.4 cm in short axis. The thyroid gland is unremarkable. No axillary lymphadenopathy is seen. Lungs/Pleura: A small left pleural effusion is noted, with complete consolidation of the left lower lobe. This is concerning for pneumonia. Mild patchy airspace opacities are seen within the remainder of both lungs, also reflecting pneumonia. No pneumothorax is seen. A right-sided chest tube is grossly unremarkable in appearance. Upper Abdomen: The liver and spleen are grossly unremarkable in appearance. The patient is status post cholecystectomy, with clips noted at the gallbladder fossa. The patient is status post gastric bypass surgery. The visualized portions of the adrenal glands and kidneys are grossly unremarkable. Musculoskeletal: No acute osseous abnormalities are identified. The visualized musculature is unremarkable in appearance. Review of the MIP images confirms the above findings. IMPRESSION: 1. No evidence of significant pulmonary embolus. 2. Small left pleural effusion, with complete consolidation of the left lower lung lobe. Additional patchy bilateral airspace opacities. Findings compatible with multifocal pneumonia. 3. Enlarged mediastinal nodes, measuring up to 1.4 cm in short axis. This could reflect the underlying infection. 4. Diffuse coronary artery calcifications seen. 5. Small pericardial effusion noted. 6. Prominence of the pulmonary arteries raises concern for pulmonary arterial hypertension. 7. Status post gastric bypass surgery. Enteric tube noted extending to the jejunum. 8. Right-sided chest tube is unremarkable in appearance. No evidence of pneumothorax. Electronically Signed   By: Roanna RaiderJeffery  Chang M.D.   On: 12/30/2018 21:47   Dg Chest Port 1 View  Result  Date: 12/31/2018 CLINICAL DATA:  Atelectasis EXAM: PORTABLE CHEST 1 VIEW COMPARISON:  12/30/2018 CT scan FINDINGS: Blunted left costophrenic angle. Tracheostomy tube noted. Left central line tip: SVC. Atherosclerotic calcification of the aortic arch. Mild volume loss in the left hemithorax with shift of cardiac and mediastinal structures to the left. Nasogastric tube enters the stomach. Indistinct blunting of the right lateral costophrenic angle previously shown to be due to atelectasis. IMPRESSION: 1. Left lower lobe atelectasis and left pleural effusion although less striking compared to 12/30/2018. 2. Mild atelectasis peripherally at the right lung base. 3. Tubes and lines satisfactorily position. 4.  Aortic Atherosclerosis (ICD10-I70.0). Electronically Signed   By: Gaylyn RongWalter  Liebkemann M.D.   On: 12/31/2018 14:05    Assessment/Plan Active Problems:   Acute on chronic respiratory failure with hypoxia (HCC)   Severe sepsis (HCC)   Chronic atrial fibrillation   Chronic systolic heart failure (HCC)   Atelectasis of left lung   Alcohol abuse with unspecified alcohol-induced disorder (HCC)   Iatrogenic pneumothorax   Bilateral pulmonary embolism (HCC)   1. Acute on chronic respiratory failure with hypoxia we will continue with weaning FiO2 down as mentioned above the chest x-ray looks much improved 2. Severe sepsis resolved hemodynamically stable 3. Atelectasis of left lung improvement likely secondary to mucous plugging 4.  Chronic atrial fibrillation rate controlled 5. Chronic systolic heart failure diuresis as tolerated 6. Alcohol abuse no active withdrawal 7. Iatrogenic pneumothorax resolved 8. Bilateral pulmonary emboli resolved based on the last CT of the chest   I have personally seen and evaluated the patient, evaluated laboratory and imaging results, formulated the assessment and plan and placed orders. The Patient requires high complexity decision making for assessment and support.   Case was discussed on Rounds with the Respiratory Therapy Staff  Yevonne Pax, MD [facility] Pulmonary Critical Care Medicine Sleep Medicine

## 2019-01-02 DIAGNOSIS — J9811 Atelectasis: Secondary | ICD-10-CM | POA: Diagnosis not present

## 2019-01-02 DIAGNOSIS — I482 Chronic atrial fibrillation, unspecified: Secondary | ICD-10-CM | POA: Diagnosis not present

## 2019-01-02 DIAGNOSIS — J9621 Acute and chronic respiratory failure with hypoxia: Secondary | ICD-10-CM | POA: Diagnosis not present

## 2019-01-02 DIAGNOSIS — F1019 Alcohol abuse with unspecified alcohol-induced disorder: Secondary | ICD-10-CM | POA: Diagnosis not present

## 2019-01-02 LAB — CBC
HCT: 26.2 % — ABNORMAL LOW (ref 39.0–52.0)
Hemoglobin: 8.3 g/dL — ABNORMAL LOW (ref 13.0–17.0)
MCH: 30.4 pg (ref 26.0–34.0)
MCHC: 31.7 g/dL (ref 30.0–36.0)
MCV: 96 fL (ref 80.0–100.0)
Platelets: 251 10*3/uL (ref 150–400)
RBC: 2.73 MIL/uL — ABNORMAL LOW (ref 4.22–5.81)
RDW: 17.7 % — ABNORMAL HIGH (ref 11.5–15.5)
WBC: 11.3 10*3/uL — ABNORMAL HIGH (ref 4.0–10.5)
nRBC: 0 % (ref 0.0–0.2)

## 2019-01-02 LAB — RENAL FUNCTION PANEL
Albumin: 1.6 g/dL — ABNORMAL LOW (ref 3.5–5.0)
Anion gap: 9 (ref 5–15)
BUN: 36 mg/dL — ABNORMAL HIGH (ref 8–23)
CO2: 25 mmol/L (ref 22–32)
Calcium: 7.6 mg/dL — ABNORMAL LOW (ref 8.9–10.3)
Chloride: 100 mmol/L (ref 98–111)
Creatinine, Ser: 0.92 mg/dL (ref 0.61–1.24)
GFR calc Af Amer: >60 mL/min (ref >60)
GFR calc non Af Amer: >60 mL/min (ref >60)
Glucose, Bld: 114 mg/dL — ABNORMAL HIGH (ref 70–99)
Phosphorus: 2.3 mg/dL — ABNORMAL LOW (ref 2.5–4.6)
Potassium: 4.1 mmol/L (ref 3.5–5.1)
Sodium: 134 mmol/L — ABNORMAL LOW (ref 135–145)

## 2019-01-02 LAB — MAGNESIUM: Magnesium: 2.2 mg/dL (ref 1.7–2.4)

## 2019-01-02 NOTE — Progress Notes (Signed)
Pulmonary Critical Care [facility] [place]   PULMONARY CRITICAL CARE SERVICE  PROGRESS NOTE  Date of Service: 01/02/2019  Steven CottaGerard Wong  [id number]  DOB: 11/19/1951   DOA: 12/15/2018  Referring Physician: Carron CurieAli Hijazi, MD  HPI: Steven Wong is a 68 y.o. male seen for follow up of Acute on Chronic Respiratory Failure.  Patient is on full support right now oxygen was decreased down to 35% seems to be doing better  Medications: Reviewed on Rounds  Physical Exam:  Vitals: Temperature 97.0 pulse 79 respiratory 20 blood pressure 104 41 saturations 100%  Ventilator Settings mode ventilation assist control FiO2 35% tidal volume 500 PEEP 5  . General: Comfortable at this time . Eyes: Grossly normal lids, irises & conjunctiva . ENT: grossly tongue is normal . Neck: no obvious mass . Cardiovascular: S1 S2 normal no gallop . Respiratory: No rhonchi or rales are noted at this time . Abdomen: soft . Skin: no rash seen on limited exam . Musculoskeletal: not rigid . Psychiatric:unable to assess . Neurologic: no seizure no involuntary movements         Lab Data:   Basic Metabolic Panel: Recent Labs  Lab 12/30/18 0612 12/30/18 1503 12/31/18 0623 01/01/19 0804 01/02/19 0541  NA 134* 135 134* 131* 134*  K 3.2* 2.9* 2.7* 4.7 4.1  CL 101 99 98 100 100  CO2 25 26 26 22 25   GLUCOSE 83 90 107* 137* 114*  BUN 10 10 12  25* 36*  CREATININE 0.69 0.68 0.70 0.91 0.92  CALCIUM 7.4* 8.0* 7.5* 7.7* 7.6*  MG 1.8 1.7 1.6* 2.2 2.2  PHOS  --   --   --   --  2.3*    ABG: Recent Labs  Lab 12/27/18 1505 12/28/18 0443 12/30/18 0925 12/30/18 1135 12/31/18 1000  PHART 7.510* 7.516* 7.489* 7.488* 7.487*  PCO2ART 37.8 38.3 38.1 38.0 31.1*  PO2ART 57.6* 115* 41.6* 61.6* 205*  HCO3 30.0* 30.7* 28.7* 28.5* 23.3  O2SAT 91.1 97.1 75.9 91.3 98.2    Liver Function Tests: Recent Labs  Lab 01/01/19 0804 01/02/19 0541  AST 21  --   ALT 13  --   ALKPHOS 49  --    BILITOT 0.6  --   PROT 4.5*  --   ALBUMIN 1.6* 1.6*   No results for input(s): LIPASE, AMYLASE in the last 168 hours. No results for input(s): AMMONIA in the last 168 hours.  CBC: Recent Labs  Lab 12/28/18 0458 12/30/18 1308 12/31/18 0623 01/01/19 0804 01/02/19 0541  WBC 12.1* 16.6* 18.7* 13.2* 11.3*  NEUTROABS  --  13.9*  --   --   --   HGB 9.4* 11.4* 9.9* 8.7* 8.3*  HCT 29.9* 35.2* 29.7* 26.8* 26.2*  MCV 95.2 92.4 93.1 96.8 96.0  PLT 289 308 278 263 251    Cardiac Enzymes: No results for input(s): CKTOTAL, CKMB, CKMBINDEX, TROPONINI in the last 168 hours.  BNP (last 3 results) No results for input(s): BNP in the last 8760 hours.  ProBNP (last 3 results) No results for input(s): PROBNP in the last 8760 hours.  Radiological Exams: Dg Chest Port 1 View  Result Date: 12/31/2018 CLINICAL DATA:  Atelectasis EXAM: PORTABLE CHEST 1 VIEW COMPARISON:  12/30/2018 CT scan FINDINGS: Blunted left costophrenic angle. Tracheostomy tube noted. Left central line tip: SVC. Atherosclerotic calcification of the aortic arch. Mild volume loss in the left hemithorax with shift of cardiac and mediastinal structures to the left. Nasogastric tube enters the stomach. Indistinct blunting of  the right lateral costophrenic angle previously shown to be due to atelectasis. IMPRESSION: 1. Left lower lobe atelectasis and left pleural effusion although less striking compared to 12/30/2018. 2. Mild atelectasis peripherally at the right lung base. 3. Tubes and lines satisfactorily position. 4.  Aortic Atherosclerosis (ICD10-I70.0). Electronically Signed   By: Gaylyn Rong M.D.   On: 12/31/2018 14:05    Assessment/Plan Active Problems:   Acute on chronic respiratory failure with hypoxia (HCC)   Severe sepsis (HCC)   Chronic atrial fibrillation   Chronic systolic heart failure (HCC)   Atelectasis of left lung   Alcohol abuse with unspecified alcohol-induced disorder (HCC)   Iatrogenic pneumothorax    Bilateral pulmonary embolism (HCC)   1. Acute on chronic respiratory failure with hypoxia we will continue with weaning FiO2 down as tolerated and begin a wean protocol. 2. Severe sepsis hemodynamically stable 3. Chronic atrial fibrillation rate controlled 4. Chronic systolic heart failure compensated 5. Atelectasis of the lung continue with aggressive pulmonary toilet 6. Iatrogenic pneumothorax resolved 7. Pulmonary embolism resolved 8. Alcohol abuse no active withdrawal   I have personally seen and evaluated the patient, evaluated laboratory and imaging results, formulated the assessment and plan and placed orders. The Patient requires high complexity decision making for assessment and support.  Case was discussed on Rounds with the Respiratory Therapy Staff  Yevonne Pax, MD [facility] Pulmonary Critical Care Medicine Sleep Medicine

## 2019-01-02 NOTE — Progress Notes (Signed)
Per RN chest tube still to suction with minimal output, no bubbles noted in pleur vac -- instructed to place to water seal. Will repeat CXR in the morning for possible removal of chest tube.  Please call IR with questions or concerns.  Lynnette Caffey, PA-C

## 2019-01-03 ENCOUNTER — Other Ambulatory Visit (HOSPITAL_COMMUNITY): Payer: Self-pay

## 2019-01-03 DIAGNOSIS — F1019 Alcohol abuse with unspecified alcohol-induced disorder: Secondary | ICD-10-CM | POA: Diagnosis not present

## 2019-01-03 DIAGNOSIS — J9621 Acute and chronic respiratory failure with hypoxia: Secondary | ICD-10-CM | POA: Diagnosis not present

## 2019-01-03 DIAGNOSIS — I482 Chronic atrial fibrillation, unspecified: Secondary | ICD-10-CM | POA: Diagnosis not present

## 2019-01-03 DIAGNOSIS — J9811 Atelectasis: Secondary | ICD-10-CM | POA: Diagnosis not present

## 2019-01-03 LAB — MAGNESIUM: Magnesium: 2.2 mg/dL (ref 1.7–2.4)

## 2019-01-03 LAB — CBC
HCT: 26.5 % — ABNORMAL LOW (ref 39.0–52.0)
Hemoglobin: 8.4 g/dL — ABNORMAL LOW (ref 13.0–17.0)
MCH: 31.1 pg (ref 26.0–34.0)
MCHC: 31.7 g/dL (ref 30.0–36.0)
MCV: 98.1 fL (ref 80.0–100.0)
NRBC: 0 % (ref 0.0–0.2)
Platelets: 260 10*3/uL (ref 150–400)
RBC: 2.7 MIL/uL — ABNORMAL LOW (ref 4.22–5.81)
RDW: 18 % — ABNORMAL HIGH (ref 11.5–15.5)
WBC: 10.8 10*3/uL — AB (ref 4.0–10.5)

## 2019-01-03 LAB — BASIC METABOLIC PANEL
ANION GAP: 7 (ref 5–15)
BUN: 45 mg/dL — ABNORMAL HIGH (ref 8–23)
CALCIUM: 7.6 mg/dL — AB (ref 8.9–10.3)
CO2: 26 mmol/L (ref 22–32)
Chloride: 102 mmol/L (ref 98–111)
Creatinine, Ser: 0.86 mg/dL (ref 0.61–1.24)
GFR calc Af Amer: >60 mL/min (ref >60)
GFR calc non Af Amer: >60 mL/min (ref >60)
Glucose, Bld: 113 mg/dL — ABNORMAL HIGH (ref 70–99)
Potassium: 4 mmol/L (ref 3.5–5.1)
Sodium: 135 mmol/L (ref 135–145)

## 2019-01-03 LAB — PHOSPHORUS: Phosphorus: 2.3 mg/dL — ABNORMAL LOW (ref 2.5–4.6)

## 2019-01-03 NOTE — Progress Notes (Signed)
Successful right chest tube removal at bedside. EBL none and no complications. Pressure dressing with Vaseline dressing applied. CXR ordered for 1300.  Please call IR with questions/concerns.  Waylan Boga [facility], PA-C 01/03/2019, 10:34 AM

## 2019-01-03 NOTE — Progress Notes (Signed)
Pulmonary Critical Care Medicine [facility] [place]   PULMONARY CRITICAL CARE SERVICE  PROGRESS NOTE  Date of Service: 01/03/2019  Steven Wong  [id number]  DOB: 16-Aug-1951   DOA: 12/15/2018  Referring Physician: Carron Curie, MD  HPI: Steven Wong is a 68 y.o. male seen for follow up of Acute on Chronic Respiratory Failure.  Patient is currently on pressure control mode has been on 35% FiO2.  Inspiratory pressure is 18 at this time  Medications: Reviewed on Rounds  Physical Exam:  Vitals: Temperature 97.2 pulse 77 respiratory 17 blood pressure 139/65 saturations 99%  Ventilator Settings mode ventilation pressure assist control FiO2 35% tidal volume 500 PEEP 5  . General: Comfortable at this time . Eyes: Grossly normal lids, irises & conjunctiva . ENT: grossly tongue is normal . Neck: no obvious mass . Cardiovascular: S1 S2 normal no gallop . Respiratory: No rhonchi or rales are noted at this time . Abdomen: soft . Skin: no rash seen on limited exam . Musculoskeletal: not rigid . Psychiatric:unable to assess . Neurologic: no seizure no involuntary movements         Lab Data:   Basic Metabolic Panel: Recent Labs  Lab 12/30/18 1503 12/31/18 0623 01/01/19 0804 01/02/19 0541 01/03/19 0724  NA 135 134* 131* 134* 135  K 2.9* 2.7* 4.7 4.1 4.0  CL 99 98 100 100 102  CO2 26 26 22 25 26   GLUCOSE 90 107* 137* 114* 113*  BUN 10 12 25* 36* 45*  CREATININE 0.68 0.70 0.91 0.92 0.86  CALCIUM 8.0* 7.5* 7.7* 7.6* 7.6*  MG 1.7 1.6* 2.2 2.2 2.2  PHOS  --   --   --  2.3* 2.3*    ABG: Recent Labs  Lab 12/27/18 1505 12/28/18 0443 12/30/18 0925 12/30/18 1135 12/31/18 1000  PHART 7.510* 7.516* 7.489* 7.488* 7.487*  PCO2ART 37.8 38.3 38.1 38.0 31.1*  PO2ART 57.6* 115* 41.6* 61.6* 205*  HCO3 30.0* 30.7* 28.7* 28.5* 23.3  O2SAT 91.1 97.1 75.9 91.3 98.2    Liver Function Tests: Recent Labs  Lab 01/01/19 0804 01/02/19 0541  AST 21  --   ALT 13  --    ALKPHOS 49  --   BILITOT 0.6  --   PROT 4.5*  --   ALBUMIN 1.6* 1.6*   No results for input(s): LIPASE, AMYLASE in the last 168 hours. No results for input(s): AMMONIA in the last 168 hours.  CBC: Recent Labs  Lab 12/30/18 1308 12/31/18 0623 01/01/19 0804 01/02/19 0541 01/03/19 0724  WBC 16.6* 18.7* 13.2* 11.3* 10.8*  NEUTROABS 13.9*  --   --   --   --   HGB 11.4* 9.9* 8.7* 8.3* 8.4*  HCT 35.2* 29.7* 26.8* 26.2* 26.5*  MCV 92.4 93.1 96.8 96.0 98.1  PLT 308 278 263 251 260    Cardiac Enzymes: No results for input(s): CKTOTAL, CKMB, CKMBINDEX, TROPONINI in the last 168 hours.  BNP (last 3 results) No results for input(s): BNP in the last 8760 hours.  ProBNP (last 3 results) No results for input(s): PROBNP in the last 8760 hours.  Radiological Exams: Dg Chest Port 1 View  Result Date: 01/03/2019 CLINICAL DATA:  Chest tube removal EXAM: PORTABLE CHEST 1 VIEW COMPARISON:  01/03/2019 FINDINGS: Interval removal of right chest tube. No visible pneumothorax. Low lung volumes. Right base atelectasis. More confluent opacity at the left lung base with probable small effusions bilaterally. Mild cardiomegaly. Tracheostomy, NG tube and left PICC line are unchanged. IMPRESSION: Interval removal of  right chest tube.  No visible pneumothorax. Right base atelectasis. Left lower lobe atelectasis or infiltrate. Small effusions. Electronically Signed   By: Charlett Nose M.D.   On: 01/03/2019 12:07   Dg Chest Port 1 View  Result Date: 01/03/2019 CLINICAL DATA:  Chest tube placement for pneumothorax EXAM: PORTABLE CHEST 1 VIEW COMPARISON:  December 31, 2018 FINDINGS: Tracheostomy catheter tip is 6.1 cm above the carina. Chest tube is present on the right. Nasogastric tube tip and side port in stomach. Central catheter tip is in the superior vena cava. No pneumothorax. There is consolidation in the left lower lobe with small pleural effusions bilaterally. Heart is upper normal in size with pulmonary  vascularity normal. There is aortic atherosclerosis. No adenopathy. There is a suspected bone island in the lateral left scapula, stable. IMPRESSION: Tube and catheter positions as described. No evident pneumothorax. Airspace consolidation consistent with pneumonia left lower lobe. Small pleural effusions bilaterally. Stable cardiac silhouette. There is aortic atherosclerosis. Aortic Atherosclerosis (ICD10-I70.0). Electronically Signed   By: Bretta Bang III M.D.   On: 01/03/2019 07:23    Assessment/Plan Active Problems:   Acute on chronic respiratory failure with hypoxia (HCC)   Severe sepsis (HCC)   Chronic atrial fibrillation   Chronic systolic heart failure (HCC)   Atelectasis of left lung   Alcohol abuse with unspecified alcohol-induced disorder (HCC)   Iatrogenic pneumothorax   Bilateral pulmonary embolism (HCC)   1. Acute on chronic respiratory failure with hypoxia we will continue with full support on pressure control mode patient is going to be checked for his RSB I and try to wean again. 2. Severe sepsis resolved 3. Chronic atrial fibrillation rate is controlled 4. Chronic systolic heart failure continue monitoring fluid status 5. Atelectasis of the lung improved the most recent chest x-ray shows consolidation of the left lower lobe but is still improved from prior 6. Iatrogenic pneumothorax resolved 7. Bilateral pulmonary emboli resolved based on CT results   I have personally seen and evaluated the patient, evaluated laboratory and imaging results, formulated the assessment and plan and placed orders. The Patient requires high complexity decision making for assessment and support.  Case was discussed on Rounds with the Respiratory Therapy Staff  Yevonne Pax, MD [facility] Pulmonary Critical Care Medicine Sleep Medicine

## 2019-01-04 DIAGNOSIS — F1019 Alcohol abuse with unspecified alcohol-induced disorder: Secondary | ICD-10-CM | POA: Diagnosis not present

## 2019-01-04 DIAGNOSIS — J9621 Acute and chronic respiratory failure with hypoxia: Secondary | ICD-10-CM | POA: Diagnosis not present

## 2019-01-04 DIAGNOSIS — J9811 Atelectasis: Secondary | ICD-10-CM | POA: Diagnosis not present

## 2019-01-04 DIAGNOSIS — I482 Chronic atrial fibrillation, unspecified: Secondary | ICD-10-CM | POA: Diagnosis not present

## 2019-01-04 LAB — BASIC METABOLIC PANEL
Anion gap: 8 (ref 5–15)
BUN: 52 mg/dL — ABNORMAL HIGH (ref 8–23)
CO2: 26 mmol/L (ref 22–32)
CREATININE: 0.92 mg/dL (ref 0.61–1.24)
Calcium: 7.6 mg/dL — ABNORMAL LOW (ref 8.9–10.3)
Chloride: 102 mmol/L (ref 98–111)
GFR calc Af Amer: >60 mL/min (ref >60)
GFR calc non Af Amer: >60 mL/min (ref >60)
Glucose, Bld: 113 mg/dL — ABNORMAL HIGH (ref 70–99)
Potassium: 3.7 mmol/L (ref 3.5–5.1)
Sodium: 136 mmol/L (ref 135–145)

## 2019-01-04 LAB — PROTIME-INR
INR: 1.23
Prothrombin Time: 15.4 seconds — ABNORMAL HIGH (ref 11.4–15.2)

## 2019-01-04 LAB — PHOSPHORUS: Phosphorus: 2.9 mg/dL (ref 2.5–4.6)

## 2019-01-04 LAB — MAGNESIUM: Magnesium: 2.3 mg/dL (ref 1.7–2.4)

## 2019-01-04 NOTE — Progress Notes (Signed)
Pulmonary Critical Care Medicine [facility] [place]   PULMONARY CRITICAL CARE SERVICE  PROGRESS NOTE  Date of Service: 01/04/2019  Steven Wong  [id number]  DOB: 1951/03/12   DOA: 12/15/2018  Referring Physician: Carron Curie, MD  HPI: Steven Wong is a 67 y.o. male seen for follow up of Acute on Chronic Respiratory Failure.  Currently patient is on pressure support wean seems to be doing well at this time.  Has been requiring 35% FiO2.  His x-ray does look improved however still has some lower lobe consolidation noted.  Medications: Reviewed on Rounds  Physical Exam:  Vitals: Temperature 97.6 pulse 58 respiratory 16 blood pressure 92/59 saturations 100%  Ventilator Settings mode ventilation pressure support FiO2 35% tidal line 400 pressure 12 PEEP 5  . General: Comfortable at this time . Eyes: Grossly normal lids, irises & conjunctiva . ENT: grossly tongue is normal . Neck: no obvious mass . Cardiovascular: S1 S2 normal no gallop . Respiratory: Coarse rhonchi expansion not noted to be diminished breath sounds on the left . Abdomen: soft . Skin: no rash seen on limited exam . Musculoskeletal: not rigid . Psychiatric:unable to assess . Neurologic: no seizure no involuntary movements         Lab Data:   Basic Metabolic Panel: Recent Labs  Lab 12/31/18 0623 01/01/19 0804 01/02/19 0541 01/03/19 0724 01/04/19 0428  NA 134* 131* 134* 135 136  K 2.7* 4.7 4.1 4.0 3.7  CL 98 100 100 102 102  CO2 26 22 25 26 26   GLUCOSE 107* 137* 114* 113* 113*  BUN 12 25* 36* 45* 52*  CREATININE 0.70 0.91 0.92 0.86 0.92  CALCIUM 7.5* 7.7* 7.6* 7.6* 7.6*  MG 1.6* 2.2 2.2 2.2 2.3  PHOS  --   --  2.3* 2.3* 2.9    ABG: Recent Labs  Lab 12/30/18 0925 12/30/18 1135 12/31/18 1000  PHART 7.489* 7.488* 7.487*  PCO2ART 38.1 38.0 31.1*  PO2ART 41.6* 61.6* 205*  HCO3 28.7* 28.5* 23.3  O2SAT 75.9 91.3 98.2    Liver Function Tests: Recent Labs  Lab 01/01/19 0804  01/02/19 0541  AST 21  --   ALT 13  --   ALKPHOS 49  --   BILITOT 0.6  --   PROT 4.5*  --   ALBUMIN 1.6* 1.6*   No results for input(s): LIPASE, AMYLASE in the last 168 hours. No results for input(s): AMMONIA in the last 168 hours.  CBC: Recent Labs  Lab 12/30/18 1308 12/31/18 0623 01/01/19 0804 01/02/19 0541 01/03/19 0724  WBC 16.6* 18.7* 13.2* 11.3* 10.8*  NEUTROABS 13.9*  --   --   --   --   HGB 11.4* 9.9* 8.7* 8.3* 8.4*  HCT 35.2* 29.7* 26.8* 26.2* 26.5*  MCV 92.4 93.1 96.8 96.0 98.1  PLT 308 278 263 251 260    Cardiac Enzymes: No results for input(s): CKTOTAL, CKMB, CKMBINDEX, TROPONINI in the last 168 hours.  BNP (last 3 results) No results for input(s): BNP in the last 8760 hours.  ProBNP (last 3 results) No results for input(s): PROBNP in the last 8760 hours.  Radiological Exams: Dg Chest Port 1 View  Result Date: 01/03/2019 CLINICAL DATA:  Chest tube removal EXAM: PORTABLE CHEST 1 VIEW COMPARISON:  01/03/2019 FINDINGS: Interval removal of right chest tube. No visible pneumothorax. Low lung volumes. Right base atelectasis. More confluent opacity at the left lung base with probable small effusions bilaterally. Mild cardiomegaly. Tracheostomy, NG tube and left PICC line are unchanged.  IMPRESSION: Interval removal of right chest tube.  No visible pneumothorax. Right base atelectasis. Left lower lobe atelectasis or infiltrate. Small effusions. Electronically Signed   By: Charlett NoseKevin  Dover M.D.   On: 01/03/2019 12:07   Dg Chest Port 1 View  Result Date: 01/03/2019 CLINICAL DATA:  Chest tube placement for pneumothorax EXAM: PORTABLE CHEST 1 VIEW COMPARISON:  December 31, 2018 FINDINGS: Tracheostomy catheter tip is 6.1 cm above the carina. Chest tube is present on the right. Nasogastric tube tip and side port in stomach. Central catheter tip is in the superior vena cava. No pneumothorax. There is consolidation in the left lower lobe with small pleural effusions bilaterally. Heart  is upper normal in size with pulmonary vascularity normal. There is aortic atherosclerosis. No adenopathy. There is a suspected bone island in the lateral left scapula, stable. IMPRESSION: Tube and catheter positions as described. No evident pneumothorax. Airspace consolidation consistent with pneumonia left lower lobe. Small pleural effusions bilaterally. Stable cardiac silhouette. There is aortic atherosclerosis. Aortic Atherosclerosis (ICD10-I70.0). Electronically Signed   By: Bretta BangWilliam  Woodruff III M.D.   On: 01/03/2019 07:23    Assessment/Plan Active Problems:   Acute on chronic respiratory failure with hypoxia (HCC)   Severe sepsis (HCC)   Chronic atrial fibrillation   Chronic systolic heart failure (HCC)   Atelectasis of left lung   Alcohol abuse with unspecified alcohol-induced disorder (HCC)   Iatrogenic pneumothorax   Bilateral pulmonary embolism (HCC)   1. Acute on chronic respiratory failure with hypoxia we will continue with weaning on a wean protocol patient is tolerating pressure support fairly well. 2. Atelectasis of left lung needs aggressive pulmonary toilet 3. Severe sepsis hemodynamically stable 4. Chronic systolic heart failure right now appears to be compensated 5. Chronic atrial fibrillation rate is controlled 6. Pneumothorax resolved 7. Pulmonary embolism resolved 8. Alcohol abuse at baseline we will continue with present management   I have personally seen and evaluated the patient, evaluated laboratory and imaging results, formulated the assessment and plan and placed orders. The Patient requires high complexity decision making for assessment and support.  Case was discussed on Rounds with the Respiratory Therapy Staff  Yevonne PaxSaadat A Blondine Hottel, MD [facility] Pulmonary Critical Care Medicine Sleep Medicine

## 2019-01-05 DIAGNOSIS — J9811 Atelectasis: Secondary | ICD-10-CM | POA: Diagnosis not present

## 2019-01-05 DIAGNOSIS — J9621 Acute and chronic respiratory failure with hypoxia: Secondary | ICD-10-CM | POA: Diagnosis not present

## 2019-01-05 DIAGNOSIS — I482 Chronic atrial fibrillation, unspecified: Secondary | ICD-10-CM | POA: Diagnosis not present

## 2019-01-05 DIAGNOSIS — F1019 Alcohol abuse with unspecified alcohol-induced disorder: Secondary | ICD-10-CM | POA: Diagnosis not present

## 2019-01-05 LAB — PROTIME-INR
INR: 1.41
Prothrombin Time: 17.1 seconds — ABNORMAL HIGH (ref 11.4–15.2)

## 2019-01-05 NOTE — Progress Notes (Signed)
[facility] [place]   PULMONARY CRITICAL CARE SERVICE  PROGRESS NOTE  Date of Service: 01/05/2019  Steven CottaGerard Wong  [id number]  DOB: 12/02/1951   DOA: 12/15/2018  Referring Physician: Carron CurieAli Hijazi, MD  HPI: Steven CottaGerard Maxson is a 68 y.o. male seen for follow up of Acute on Chronic Respiratory Failure.  Currently patient is on full support.  His x-ray is same.  He has been tolerating pressure support weaning fairly well  Medications: Reviewed on Rounds  Physical Exam:  Vitals: Temperature 98.7 pulse 65 respiratory 23 blood pressure 113/54 saturations are 100%  Ventilator Settings mode ventilation pressure control FiO2 35% inspiratory pressure 16 PEEP 5 tidal volume 368  . General: Comfortable at this time . Eyes: Grossly normal lids, irises & conjunctiva . ENT: grossly tongue is normal . Neck: no obvious mass . Cardiovascular: S1 S2 normal no gallop . Respiratory: No rhonchi no rales are noted diminished at the left base . Abdomen: soft . Skin: no rash seen on limited exam . Musculoskeletal: not rigid . Psychiatric:unable to assess . Neurologic: no seizure no involuntary movements         Lab Data:   Basic Metabolic Panel: Recent Labs  Lab 12/31/18 0623 01/01/19 0804 01/02/19 0541 01/03/19 0724 01/04/19 0428  NA 134* 131* 134* 135 136  K 2.7* 4.7 4.1 4.0 3.7  CL 98 100 100 102 102  CO2 26 22 25 26 26   GLUCOSE 107* 137* 114* 113* 113*  BUN 12 25* 36* 45* 52*  CREATININE 0.70 0.91 0.92 0.86 0.92  CALCIUM 7.5* 7.7* 7.6* 7.6* 7.6*  MG 1.6* 2.2 2.2 2.2 2.3  PHOS  --   --  2.3* 2.3* 2.9    ABG: Recent Labs  Lab 12/30/18 0925 12/30/18 1135 12/31/18 1000  PHART 7.489* 7.488* 7.487*  PCO2ART 38.1 38.0 31.1*  PO2ART 41.6* 61.6* 205*  HCO3 28.7* 28.5* 23.3  O2SAT 75.9 91.3 98.2    Liver Function Tests: Recent Labs  Lab 01/01/19 0804 01/02/19 0541  AST 21  --   ALT 13  --   ALKPHOS 49  --   BILITOT 0.6  --    PROT 4.5*  --   ALBUMIN 1.6* 1.6*   No results for input(s): LIPASE, AMYLASE in the last 168 hours. No results for input(s): AMMONIA in the last 168 hours.  CBC: Recent Labs  Lab 12/30/18 1308 12/31/18 0623 01/01/19 0804 01/02/19 0541 01/03/19 0724  WBC 16.6* 18.7* 13.2* 11.3* 10.8*  NEUTROABS 13.9*  --   --   --   --   HGB 11.4* 9.9* 8.7* 8.3* 8.4*  HCT 35.2* 29.7* 26.8* 26.2* 26.5*  MCV 92.4 93.1 96.8 96.0 98.1  PLT 308 278 263 251 260    Cardiac Enzymes: No results for input(s): CKTOTAL, CKMB, CKMBINDEX, TROPONINI in the last 168 hours.  BNP (last 3 results) No results for input(s): BNP in the last 8760 hours.  ProBNP (last 3 results) No results for input(s): PROBNP in the last 8760 hours.  Radiological Exams: Dg Chest Port 1 View  Result Date: 01/03/2019 CLINICAL DATA:  Chest tube removal EXAM: PORTABLE CHEST 1 VIEW COMPARISON:  01/03/2019 FINDINGS: Interval removal of right chest tube. No visible pneumothorax. Low lung volumes. Right base atelectasis. More confluent opacity at the left lung base with probable small effusions bilaterally. Mild cardiomegaly. Tracheostomy, NG tube and left PICC line are unchanged. IMPRESSION: Interval removal of right chest tube.  No visible pneumothorax. Right base atelectasis.  Left lower lobe atelectasis or infiltrate. Small effusions. Electronically Signed   By: Charlett Nose M.D.   On: 01/03/2019 12:07    Assessment/Plan Active Problems:   Acute on chronic respiratory failure with hypoxia (HCC)   Severe sepsis (HCC)   Chronic atrial fibrillation   Chronic systolic heart failure (HCC)   Atelectasis of left lung   Alcohol abuse with unspecified alcohol-induced disorder (HCC)   Iatrogenic pneumothorax   Bilateral pulmonary embolism (HCC)   1. Acute on chronic respiratory failure with hypoxia continues to show some areas of atelectasis of the left lung will need to continue with the chest PT which does seem to be helping along with  the Mucomyst. 2. Severe sepsis is resolved 3. Chronic atrial fibrillation rate controlled 4. Chronic systolic heart failure at baseline we will continue present management 5. Left lung atelectasis continue aggressive pulmonary toilet chest PT 6. Iatrogenic pneumothorax resolved 7. Pulmonary emboli resolved continue supportive care 8. Alcohol abuse no active withdrawal   I have personally seen and evaluated the patient, evaluated laboratory and imaging results, formulated the assessment and plan and placed orders. The Patient requires high complexity decision making for assessment and support.  Case was discussed on Rounds with the Respiratory Therapy Staff  Yevonne Pax, MD [facility] Pulmonary Critical Care Medicine Sleep Medicine

## 2019-01-06 DIAGNOSIS — J9621 Acute and chronic respiratory failure with hypoxia: Secondary | ICD-10-CM | POA: Diagnosis not present

## 2019-01-06 DIAGNOSIS — F1019 Alcohol abuse with unspecified alcohol-induced disorder: Secondary | ICD-10-CM | POA: Diagnosis not present

## 2019-01-06 DIAGNOSIS — J9811 Atelectasis: Secondary | ICD-10-CM | POA: Diagnosis not present

## 2019-01-06 DIAGNOSIS — I482 Chronic atrial fibrillation, unspecified: Secondary | ICD-10-CM | POA: Diagnosis not present

## 2019-01-06 LAB — BASIC METABOLIC PANEL
Anion gap: 8 (ref 5–15)
BUN: 63 mg/dL — ABNORMAL HIGH (ref 8–23)
CO2: 30 mmol/L (ref 22–32)
Calcium: 7.9 mg/dL — ABNORMAL LOW (ref 8.9–10.3)
Chloride: 102 mmol/L (ref 98–111)
Creatinine, Ser: 0.99 mg/dL (ref 0.61–1.24)
GFR calc Af Amer: >60 mL/min (ref >60)
GFR calc non Af Amer: >60 mL/min (ref >60)
Glucose, Bld: 103 mg/dL — ABNORMAL HIGH (ref 70–99)
Potassium: 4.3 mmol/L (ref 3.5–5.1)
Sodium: 140 mmol/L (ref 135–145)

## 2019-01-06 LAB — CBC
HCT: 26.6 % — ABNORMAL LOW (ref 39.0–52.0)
HEMOGLOBIN: 8 g/dL — AB (ref 13.0–17.0)
MCH: 29.9 pg (ref 26.0–34.0)
MCHC: 30.1 g/dL (ref 30.0–36.0)
MCV: 99.3 fL (ref 80.0–100.0)
Platelets: 304 10*3/uL (ref 150–400)
RBC: 2.68 MIL/uL — ABNORMAL LOW (ref 4.22–5.81)
RDW: 18.6 % — ABNORMAL HIGH (ref 11.5–15.5)
WBC: 10.4 10*3/uL (ref 4.0–10.5)
nRBC: 0 % (ref 0.0–0.2)

## 2019-01-06 LAB — PROTIME-INR
INR: 1.54
Prothrombin Time: 18.3 seconds — ABNORMAL HIGH (ref 11.4–15.2)

## 2019-01-06 LAB — PHOSPHORUS: Phosphorus: 3.6 mg/dL (ref 2.5–4.6)

## 2019-01-06 LAB — MAGNESIUM: Magnesium: 2.6 mg/dL — ABNORMAL HIGH (ref 1.7–2.4)

## 2019-01-06 NOTE — Progress Notes (Signed)
Pulmonary Critical Care Medicine [facility] [place]   PULMONARY CRITICAL CARE SERVICE  PROGRESS NOTE  Date of Service: 01/06/2019  Steven Wong  [id number]:[id number]  DOB: Jun 11, 1951   DOA: 12/15/2018  Referring Physician: Carron Curie, MD  HPI: Steven Wong is a 68 y.o. male seen for follow up of Acute on Chronic Respiratory Failure.  Patient is currently on a T collar wean has been on 28% FiO2 we will try to do about 4 hours  Medications: Reviewed on Rounds  Physical Exam:  Vitals: Temperature 98.7 pulse 80 respiratory 18 blood pressure 100/70 saturations 98%  Ventilator Settings off the ventilator on T collar FiO2 is 28%  . General: Comfortable at this time . Eyes: Grossly normal lids, irises & conjunctiva . ENT: grossly tongue is normal . Neck: no obvious mass . Cardiovascular: S1 S2 normal no gallop . Respiratory: No rhonchi or rales are noted at this time . Abdomen: soft . Skin: no rash seen on limited exam . Musculoskeletal: not rigid . Psychiatric:unable to assess . Neurologic: no seizure no involuntary movements         Lab Data:   Basic Metabolic Panel: Recent Labs  Lab 01/01/19 0804 01/02/19 0541 01/03/19 0724 01/04/19 0428 01/06/19 0429  NA 131* 134* 135 136 140  K 4.7 4.1 4.0 3.7 4.3  CL 100 100 102 102 102  CO2 22 25 26 26 30   GLUCOSE 137* 114* 113* 113* 103*  BUN 25* 36* 45* 52* 63*  CREATININE 0.91 0.92 0.86 0.92 0.99  CALCIUM 7.7* 7.6* 7.6* 7.6* 7.9*  MG 2.2 2.2 2.2 2.3 2.6*  PHOS  --  2.3* 2.3* 2.9 3.6    ABG: Recent Labs  Lab 12/31/18 1000  PHART 7.487*  PCO2ART 31.1*  PO2ART 205*  HCO3 23.3  O2SAT 98.2    Liver Function Tests: Recent Labs  Lab 01/01/19 0804 01/02/19 0541  AST 21  --   ALT 13  --   ALKPHOS 49  --   BILITOT 0.6  --   PROT 4.5*  --   ALBUMIN 1.6* 1.6*   No results for input(s): LIPASE, AMYLASE in the last 168 hours. No results for input(s): AMMONIA in the last 168 hours.  CBC: Recent Labs   Lab 12/31/18 0623 01/01/19 0804 01/02/19 0541 01/03/19 0724 01/06/19 0429  WBC 18.7* 13.2* 11.3* 10.8* 10.4  HGB 9.9* 8.7* 8.3* 8.4* 8.0*  HCT 29.7* 26.8* 26.2* 26.5* 26.6*  MCV 93.1 96.8 96.0 98.1 99.3  PLT 278 263 251 260 304    Cardiac Enzymes: No results for input(s): CKTOTAL, CKMB, CKMBINDEX, TROPONINI in the last 168 hours.  BNP (last 3 results) No results for input(s): BNP in the last 8760 hours.  ProBNP (last 3 results) No results for input(s): PROBNP in the last 8760 hours.  Radiological Exams: No results found.  Assessment/Plan Active Problems:   Acute on chronic respiratory failure with hypoxia (HCC)   Severe sepsis (HCC)   Chronic atrial fibrillation   Chronic systolic heart failure (HCC)   Atelectasis of left lung   Alcohol abuse with unspecified alcohol-induced disorder (HCC)   Iatrogenic pneumothorax   Bilateral pulmonary embolism (HCC)   1. Acute on chronic respiratory failure hypoxia we will continue to wean on T collar as mentioned the goal is up to 4 hours continue aggressive pulmonary toilet 2. Severe sepsis hemodynamically is stable resolved 3. Chronic atrial fibrillation rate is controlled 4. Chronic systolic heart failure at baseline 5. Atelectasis of the lung aggressive  pulmonary toilet ongoing weakness 6. Alcohol abuse no active withdrawal 7. Pneumothorax resolved 8. Pulmonary emboli resolved   I have personally seen and evaluated the patient, evaluated laboratory and imaging results, formulated the assessment and plan and placed orders. The Patient requires high complexity decision making for assessment and support.  Case was discussed on Rounds with the Respiratory Therapy Staff  Yevonne PaxSaadat A Elease Swarm, MD [facility] Pulmonary Critical Care Medicine Sleep Medicine

## 2019-01-07 ENCOUNTER — Other Ambulatory Visit (HOSPITAL_COMMUNITY): Payer: Self-pay

## 2019-01-07 DIAGNOSIS — J9621 Acute and chronic respiratory failure with hypoxia: Secondary | ICD-10-CM | POA: Diagnosis not present

## 2019-01-07 DIAGNOSIS — F1019 Alcohol abuse with unspecified alcohol-induced disorder: Secondary | ICD-10-CM | POA: Diagnosis not present

## 2019-01-07 DIAGNOSIS — J9811 Atelectasis: Secondary | ICD-10-CM | POA: Diagnosis not present

## 2019-01-07 DIAGNOSIS — I482 Chronic atrial fibrillation, unspecified: Secondary | ICD-10-CM | POA: Diagnosis not present

## 2019-01-07 LAB — PROTIME-INR
INR: 1.78
Prothrombin Time: 20.5 seconds — ABNORMAL HIGH (ref 11.4–15.2)

## 2019-01-07 NOTE — Progress Notes (Signed)
Pulmonary Critical Care Medicine [facility] [place]   PULMONARY CRITICAL CARE SERVICE  PROGRESS NOTE  Date of Service: 01/07/2019  Kashis Vana  LNL:[id number]  DOB: January 15, 1951   DOA: 12/15/2018  Referring Physician: Carron Curie, MD  HPI: Ace Worman is a 68 y.o. male seen for follow up of Acute on Chronic Respiratory Failure.  Patient at this time is comfortable without distress is on T collar has been on 28% oxygen  Medications: Reviewed on Rounds  Physical Exam:  Vitals: Temperature 98.1 pulse 77 respiratory rate 14 blood pressure 109/66 saturations 100%  Ventilator Settings currently on T collar FiO2 28%  . General: Comfortable at this time . Eyes: Grossly normal lids, irises & conjunctiva . ENT: grossly tongue is normal . Neck: no obvious mass . Cardiovascular: S1 S2 normal no gallop . Respiratory: No rhonchi or rales are noted at this time . Abdomen: soft . Skin: no rash seen on limited exam . Musculoskeletal: not rigid . Psychiatric:unable to assess . Neurologic: no seizure no involuntary movements         Lab Data:   Basic Metabolic Panel: Recent Labs  Lab 01/01/19 0804 01/02/19 0541 01/03/19 0724 01/04/19 0428 01/06/19 0429  NA 131* 134* 135 136 140  K 4.7 4.1 4.0 3.7 4.3  CL 100 100 102 102 102  CO2 22 25 26 26 30   GLUCOSE 137* 114* 113* 113* 103*  BUN 25* 36* 45* 52* 63*  CREATININE 0.91 0.92 0.86 0.92 0.99  CALCIUM 7.7* 7.6* 7.6* 7.6* 7.9*  MG 2.2 2.2 2.2 2.3 2.6*  PHOS  --  2.3* 2.3* 2.9 3.6    ABG: No results for input(s): PHART, PCO2ART, PO2ART, HCO3, O2SAT in the last 168 hours.  Liver Function Tests: Recent Labs  Lab 01/01/19 0804 01/02/19 0541  AST 21  --   ALT 13  --   ALKPHOS 49  --   BILITOT 0.6  --   PROT 4.5*  --   ALBUMIN 1.6* 1.6*   No results for input(s): LIPASE, AMYLASE in the last 168 hours. No results for input(s): AMMONIA in the last 168 hours.  CBC: Recent Labs  Lab 01/01/19 0804  01/02/19 0541 01/03/19 0724 01/06/19 0429  WBC 13.2* 11.3* 10.8* 10.4  HGB 8.7* 8.3* 8.4* 8.0*  HCT 26.8* 26.2* 26.5* 26.6*  MCV 96.8 96.0 98.1 99.3  PLT 263 251 260 304    Cardiac Enzymes: No results for input(s): CKTOTAL, CKMB, CKMBINDEX, TROPONINI in the last 168 hours.  BNP (last 3 results) No results for input(s): BNP in the last 8760 hours.  ProBNP (last 3 results) No results for input(s): PROBNP in the last 8760 hours.  Radiological Exams: Dg Chest Port 1 View  Result Date: 01/07/2019 CLINICAL DATA:  Pleural effusion EXAM: PORTABLE CHEST 1 VIEW COMPARISON:  Chest radiograph 01/03/2019 FINDINGS: Tracheostomy tube proximal trachea. Enteric tube courses inferior to the diaphragm. Left upper extremity PICC line tip projects over the superior vena cava. Monitoring leads overlie the patient. Stable cardiac and mediastinal contours. Aortic atherosclerosis. Small bilateral pleural effusions. (9 basilar heterogeneous opacities, unchanged. IMPRESSION: Stable support apparatus. Similar small effusions and basilar opacities favored to represent atelectasis. Electronically Signed   By: Annia Belt M.D.   On: 01/07/2019 08:10    Assessment/Plan Active Problems:   Acute on chronic respiratory failure with hypoxia (HCC)   Severe sepsis (HCC)   Chronic atrial fibrillation   Chronic systolic heart failure (HCC)   Atelectasis of left lung  Alcohol abuse with unspecified alcohol-induced disorder (HCC)   Iatrogenic pneumothorax   Bilateral pulmonary embolism (HCC)   1. Acute on chronic respiratory failure with hypoxia patient is doing better with weaning we will continue to advance on T collar as tolerated. 2. Severe sepsis right now hemodynamically stable 3. Chronic atrial fibrillation rate controlled 4. Chronic systolic heart failure at baseline 5. Atelectasis continue with aggressive pulmonary toilet 6. Pneumothorax resolved 7. Pulmonary embolism resolved   I have personally seen  and evaluated the patient, evaluated laboratory and imaging results, formulated the assessment and plan and placed orders. The Patient requires high complexity decision making for assessment and support.  Case was discussed on Rounds with the Respiratory Therapy Staff  Yevonne Pax, MD [facility] Pulmonary Critical Care Medicine Sleep Medicine

## 2019-01-08 DIAGNOSIS — F1019 Alcohol abuse with unspecified alcohol-induced disorder: Secondary | ICD-10-CM | POA: Diagnosis not present

## 2019-01-08 DIAGNOSIS — I482 Chronic atrial fibrillation, unspecified: Secondary | ICD-10-CM | POA: Diagnosis not present

## 2019-01-08 DIAGNOSIS — J9811 Atelectasis: Secondary | ICD-10-CM | POA: Diagnosis not present

## 2019-01-08 DIAGNOSIS — J9621 Acute and chronic respiratory failure with hypoxia: Secondary | ICD-10-CM | POA: Diagnosis not present

## 2019-01-08 LAB — CBC
HCT: 25.9 % — ABNORMAL LOW (ref 39.0–52.0)
HEMOGLOBIN: 8 g/dL — AB (ref 13.0–17.0)
MCH: 31 pg (ref 26.0–34.0)
MCHC: 30.9 g/dL (ref 30.0–36.0)
MCV: 100.4 fL — ABNORMAL HIGH (ref 80.0–100.0)
Platelets: 336 10*3/uL (ref 150–400)
RBC: 2.58 MIL/uL — ABNORMAL LOW (ref 4.22–5.81)
RDW: 18.7 % — ABNORMAL HIGH (ref 11.5–15.5)
WBC: 7.9 10*3/uL (ref 4.0–10.5)
nRBC: 0 % (ref 0.0–0.2)

## 2019-01-08 LAB — BASIC METABOLIC PANEL
ANION GAP: 8 (ref 5–15)
BUN: 56 mg/dL — ABNORMAL HIGH (ref 8–23)
CO2: 31 mmol/L (ref 22–32)
Calcium: 7.9 mg/dL — ABNORMAL LOW (ref 8.9–10.3)
Chloride: 100 mmol/L (ref 98–111)
Creatinine, Ser: 0.75 mg/dL (ref 0.61–1.24)
GFR calc Af Amer: >60 mL/min (ref >60)
GFR calc non Af Amer: >60 mL/min (ref >60)
Glucose, Bld: 109 mg/dL — ABNORMAL HIGH (ref 70–99)
Potassium: 4.1 mmol/L (ref 3.5–5.1)
Sodium: 139 mmol/L (ref 135–145)

## 2019-01-08 LAB — PROTIME-INR
INR: 1.85
Prothrombin Time: 21.1 seconds — ABNORMAL HIGH (ref 11.4–15.2)

## 2019-01-08 LAB — MAGNESIUM: MAGNESIUM: 2.6 mg/dL — AB (ref 1.7–2.4)

## 2019-01-08 LAB — PHOSPHORUS: PHOSPHORUS: 3.3 mg/dL (ref 2.5–4.6)

## 2019-01-08 NOTE — Progress Notes (Signed)
[facility] [place]   PULMONARY CRITICAL CARE SERVICE  PROGRESS NOTE  Date of Service: 01/08/2019  Steven Wong  ZOX:[id number]  DOB: 02/03/1951   DOA: 12/15/2018  Referring Physician: Carron CurieAli Hijazi, MD  HPI: Steven CottaGerard Wong is a 68 y.o. male seen for follow up of Acute on Chronic Respiratory Failure.  Patient at this time is on T collar the goal is for 12 hours doing well so far  Medications: Reviewed on Rounds  Physical Exam:  Vitals: Temperature 96.8 pulse 70 respiratory 30 blood pressure 109/67 saturation 98%  Ventilator Settings off the ventilator on T collar at this time secretions are improving slowly  . General: Comfortable at this time . Eyes: Grossly normal lids, irises & conjunctiva . ENT: grossly tongue is normal . Neck: no obvious mass . Cardiovascular: S1 S2 normal no Wong . Respiratory: No rhonchi or rales are noted at this time . Abdomen: soft . Skin: no rash seen on limited exam . Musculoskeletal: not rigid . Psychiatric:unable to assess . Neurologic: no seizure no involuntary movements         Lab Data:   Basic Metabolic Panel: Recent Labs  Lab 01/02/19 0541 01/03/19 0724 01/04/19 0428 01/06/19 0429 01/08/19 0605  NA 134* 135 136 140 139  K 4.1 4.0 3.7 4.3 4.1  CL 100 102 102 102 100  CO2 25 26 26 30 31   GLUCOSE 114* 113* 113* 103* 109*  BUN 36* 45* 52* 63* 56*  CREATININE 0.92 0.86 0.92 0.99 0.75  CALCIUM 7.6* 7.6* 7.6* 7.9* 7.9*  MG 2.2 2.2 2.3 2.6* 2.6*  PHOS 2.3* 2.3* 2.9 3.6 3.3    ABG: No results for input(s): PHART, PCO2ART, PO2ART, HCO3, O2SAT in the last 168 hours.  Liver Function Tests: Recent Labs  Lab 01/02/19 0541  ALBUMIN 1.6*   No results for input(s): LIPASE, AMYLASE in the last 168 hours. No results for input(s): AMMONIA in the last 168 hours.  CBC: Recent Labs  Lab 01/02/19 0541 01/03/19 0724 01/06/19 0429 01/08/19 0605  WBC 11.3* 10.8* 10.4 7.9  HGB 8.3* 8.4*  8.0* 8.0*  HCT 26.2* 26.5* 26.6* 25.9*  MCV 96.0 98.1 99.3 100.4*  PLT 251 260 304 336    Cardiac Enzymes: No results for input(s): CKTOTAL, CKMB, CKMBINDEX, TROPONINI in the last 168 hours.  BNP (last 3 results) No results for input(s): BNP in the last 8760 hours.  ProBNP (last 3 results) No results for input(s): PROBNP in the last 8760 hours.  Radiological Exams: Dg Chest Port 1 View  Result Date: 01/07/2019 CLINICAL DATA:  Pleural effusion EXAM: PORTABLE CHEST 1 VIEW COMPARISON:  Chest radiograph 01/03/2019 FINDINGS: Tracheostomy tube proximal trachea. Enteric tube courses inferior to the diaphragm. Left upper extremity PICC line tip projects over the superior vena cava. Monitoring leads overlie the patient. Stable cardiac and mediastinal contours. Aortic atherosclerosis. Small bilateral pleural effusions. (9 basilar heterogeneous opacities, unchanged. IMPRESSION: Stable support apparatus. Similar small effusions and basilar opacities favored to represent atelectasis. Electronically Signed   By: Annia Beltrew  Davis M.D.   On: 01/07/2019 08:10    Assessment/Plan Active Problems:   Acute on chronic respiratory failure with hypoxia (HCC)   Severe sepsis (HCC)   Chronic atrial fibrillation   Chronic systolic heart failure (HCC)   Atelectasis of left lung   Alcohol abuse with unspecified alcohol-induced disorder (HCC)   Iatrogenic pneumothorax   Bilateral pulmonary embolism (HCC)   1. Acute on chronic respiratory failure with hypoxia we  will continue with the T collar weaning goal today as tolerated we will continue to advance. 2. Severe sepsis hemodynamically stable resolved 3. Chronic atrial fibrillation rate controlled 4. Chronic systolic heart failure monitor fluid status 5. Atelectasis of left lung continue pulmonary toilet 6. Alcohol abuse no active withdrawal 7. Bilateral pulmonary emboli resolved   I have personally seen and evaluated the patient, evaluated laboratory and  imaging results, formulated the assessment and plan and placed orders. The Patient requires high complexity decision making for assessment and support.  Case was discussed on Rounds with the Respiratory Therapy Staff  Yevonne Pax, MD [facility] Pulmonary Critical Care Medicine Sleep Medicine

## 2019-01-09 DIAGNOSIS — I482 Chronic atrial fibrillation, unspecified: Secondary | ICD-10-CM | POA: Diagnosis not present

## 2019-01-09 DIAGNOSIS — F1019 Alcohol abuse with unspecified alcohol-induced disorder: Secondary | ICD-10-CM | POA: Diagnosis not present

## 2019-01-09 DIAGNOSIS — J9621 Acute and chronic respiratory failure with hypoxia: Secondary | ICD-10-CM | POA: Diagnosis not present

## 2019-01-09 DIAGNOSIS — J9811 Atelectasis: Secondary | ICD-10-CM | POA: Diagnosis not present

## 2019-01-09 LAB — PROTIME-INR
INR: 1.6
Prothrombin Time: 18.8 seconds — ABNORMAL HIGH (ref 11.4–15.2)

## 2019-01-09 NOTE — Progress Notes (Signed)
[facility] [place]   PULMONARY CRITICAL CARE SERVICE  PROGRESS NOTE  Date of Service: 01/09/2019  Steven Wong  ZOX:[id number]  DOB: 06/12/1951   DOA: 12/15/2018  Referring Physician: Carron CurieAli Hijazi, MD  HPI: Steven Wong is a 68 y.o. male seen for follow up of Acute on Chronic Respiratory Failure.  Patient is weaning on T collar today the goal is 16 hours seems to be tolerating well still has issues with secretions  Medications: Reviewed on Rounds  Physical Exam:  Vitals: Temperature 97.3 pulse 81 respiratory 17 blood pressure 94/52 saturations 99%  Ventilator Settings currently is on T collar FiO2 28%  . General: Comfortable at this time . Eyes: Grossly normal lids, irises & conjunctiva . ENT: grossly tongue is normal . Neck: no obvious mass . Cardiovascular: S1 S2 normal no gallop . Respiratory: No rhonchi or rales are noted at this time . Abdomen: soft . Skin: no rash seen on limited exam . Musculoskeletal: not rigid . Psychiatric:unable to assess . Neurologic: no seizure no involuntary movements         Lab Data:   Basic Metabolic Panel: Recent Labs  Lab 01/03/19 0724 01/04/19 0428 01/06/19 0429 01/08/19 0605  NA 135 136 140 139  K 4.0 3.7 4.3 4.1  CL 102 102 102 100  CO2 26 26 30 31   GLUCOSE 113* 113* 103* 109*  BUN 45* 52* 63* 56*  CREATININE 0.86 0.92 0.99 0.75  CALCIUM 7.6* 7.6* 7.9* 7.9*  MG 2.2 2.3 2.6* 2.6*  PHOS 2.3* 2.9 3.6 3.3    ABG: No results for input(s): PHART, PCO2ART, PO2ART, HCO3, O2SAT in the last 168 hours.  Liver Function Tests: No results for input(s): AST, ALT, ALKPHOS, BILITOT, PROT, ALBUMIN in the last 168 hours. No results for input(s): LIPASE, AMYLASE in the last 168 hours. No results for input(s): AMMONIA in the last 168 hours.  CBC: Recent Labs  Lab 01/03/19 0724 01/06/19 0429 01/08/19 0605  WBC 10.8* 10.4 7.9  HGB 8.4* 8.0* 8.0*  HCT 26.5* 26.6* 25.9*  MCV 98.1  99.3 100.4*  PLT 260 304 336    Cardiac Enzymes: No results for input(s): CKTOTAL, CKMB, CKMBINDEX, TROPONINI in the last 168 hours.  BNP (last 3 results) No results for input(s): BNP in the last 8760 hours.  ProBNP (last 3 results) No results for input(s): PROBNP in the last 8760 hours.  Radiological Exams: No results found.  Assessment/Plan Active Problems:   Acute on chronic respiratory failure with hypoxia (HCC)   Severe sepsis (HCC)   Chronic atrial fibrillation   Chronic systolic heart failure (HCC)   Atelectasis of left lung   Alcohol abuse with unspecified alcohol-induced disorder (HCC)   Iatrogenic pneumothorax   Bilateral pulmonary embolism (HCC)   1. Acute on chronic respiratory failure with hypoxia we will continue with the weaning protocol.  Tolerating well so far 2. Severe sepsis resolved 3. Chronic atrial fibrillation rate controlled 4. Chronic systolic heart failure at baseline 5. Atelectasis continue aggressive pulmonary toilet 6. Bilateral pulmonary emboli resolved 7. Pneumothorax resolved 8. Alcohol abuse no active withdrawal   I have personally seen and evaluated the patient, evaluated laboratory and imaging results, formulated the assessment and plan and placed orders. The Patient requires high complexity decision making for assessment and support.  Case was discussed on Rounds with the Respiratory Therapy Staff  Yevonne PaxSaadat A Elizah Lydon, MD [facility] Pulmonary Critical Care Medicine Sleep Medicine

## 2019-01-10 DIAGNOSIS — I482 Chronic atrial fibrillation, unspecified: Secondary | ICD-10-CM | POA: Diagnosis not present

## 2019-01-10 DIAGNOSIS — F1019 Alcohol abuse with unspecified alcohol-induced disorder: Secondary | ICD-10-CM | POA: Diagnosis not present

## 2019-01-10 DIAGNOSIS — J9811 Atelectasis: Secondary | ICD-10-CM | POA: Diagnosis not present

## 2019-01-10 DIAGNOSIS — J9621 Acute and chronic respiratory failure with hypoxia: Secondary | ICD-10-CM | POA: Diagnosis not present

## 2019-01-10 LAB — PROTIME-INR
INR: 1.88
Prothrombin Time: 21.3 seconds — ABNORMAL HIGH (ref 11.4–15.2)

## 2019-01-10 NOTE — Progress Notes (Signed)
Pulmonary Critical Care Medicine [facility] [place]   PULMONARY CRITICAL CARE SERVICE  PROGRESS NOTE  Date of Service: 01/10/2019  Steven Wong  [id number]  DOB: [birth year]   DOA: 12/15/2018  Referring Physician: Carron Curie, MD  HPI: Steven Wong is a 68 y.o. male seen for follow up of Acute on Chronic Respiratory Failure.  Patient is on T collar right now the goal is for 20 hours.  He was pleading to have something to eat I explained to him that once he is completed his 24 hours off the ventilator we will have speech therapy look at them once again.  He is at high risk for aspiration and I did communicate this to him.  Medications: Reviewed on Rounds  Physical Exam:  Vitals: Temperature 97.2 pulse 65 respiratory 17 blood pressure 92/76 saturations 98%  Ventilator Settings patient is off the ventilator on T collar right now  . General: Comfortable at this time . Eyes: Grossly normal lids, irises & conjunctiva . ENT: grossly tongue is normal . Neck: no obvious mass . Cardiovascular: S1 S2 normal no gallop . Respiratory: No rhonchi or rales are noted . Abdomen: soft . Skin: no rash seen on limited exam . Musculoskeletal: not rigid . Psychiatric:unable to assess . Neurologic: no seizure no involuntary movements         Lab Data:   Basic Metabolic Panel: Recent Labs  Lab 01/04/19 0428 01/06/19 0429 01/08/19 0605  NA 136 140 139  K 3.7 4.3 4.1  CL 102 102 100  CO2 26 30 31   GLUCOSE 113* 103* 109*  BUN 52* 63* 56*  CREATININE 0.92 0.99 0.75  CALCIUM 7.6* 7.9* 7.9*  MG 2.3 2.6* 2.6*  PHOS 2.9 3.6 3.3    ABG: No results for input(s): PHART, PCO2ART, PO2ART, HCO3, O2SAT in the last 168 hours.  Liver Function Tests: No results for input(s): AST, ALT, ALKPHOS, BILITOT, PROT, ALBUMIN in the last 168 hours. No results for input(s): LIPASE, AMYLASE in the last 168 hours. No results for input(s): AMMONIA in the last 168 hours.  CBC: Recent Labs   Lab 01/06/19 0429 01/08/19 0605  WBC 10.4 7.9  HGB 8.0* 8.0*  HCT 26.6* 25.9*  MCV 99.3 100.4*  PLT 304 336    Cardiac Enzymes: No results for input(s): CKTOTAL, CKMB, CKMBINDEX, TROPONINI in the last 168 hours.  BNP (last 3 results) No results for input(s): BNP in the last 8760 hours.  ProBNP (last 3 results) No results for input(s): PROBNP in the last 8760 hours.  Radiological Exams: No results found.  Assessment/Plan Active Problems:   Acute on chronic respiratory failure with hypoxia (HCC)   Severe sepsis (HCC)   Chronic atrial fibrillation   Chronic systolic heart failure (HCC)   Atelectasis of left lung   Alcohol abuse with unspecified alcohol-induced disorder (HCC)   Iatrogenic pneumothorax   Bilateral pulmonary embolism (HCC)   1. Acute on chronic respiratory failure with hypoxia we will continue with T collar trials continue to advance to 24 hours.  Continue pulmonary toilet supportive care. 2. Severe sepsis hemodynamically stable 3. Chronic atrial fibrillation rate is controlled 4. Chronic systolic heart failure at baseline we will continue present management 5. Atelectasis of the lung continue aggressive pulmonary toilet 6. Pneumothorax resolved 7. Pulmonary embolism treated resolved 8. Alcohol abuse no active withdrawal noted   I have personally seen and evaluated the patient, evaluated laboratory and imaging results, formulated the assessment and plan and placed orders. The Patient requires  high complexity decision making for assessment and support.  Case was discussed on Rounds with the Respiratory Therapy Staff  Allyne Gee, MD [facility] Pulmonary Critical Care Medicine Sleep Medicine

## 2019-01-11 ENCOUNTER — Other Ambulatory Visit (HOSPITAL_COMMUNITY): Payer: Self-pay

## 2019-01-11 DIAGNOSIS — J9811 Atelectasis: Secondary | ICD-10-CM | POA: Diagnosis not present

## 2019-01-11 DIAGNOSIS — J9621 Acute and chronic respiratory failure with hypoxia: Secondary | ICD-10-CM | POA: Diagnosis not present

## 2019-01-11 DIAGNOSIS — F1019 Alcohol abuse with unspecified alcohol-induced disorder: Secondary | ICD-10-CM | POA: Diagnosis not present

## 2019-01-11 DIAGNOSIS — I482 Chronic atrial fibrillation, unspecified: Secondary | ICD-10-CM | POA: Diagnosis not present

## 2019-01-11 LAB — BASIC METABOLIC PANEL
ANION GAP: 9 (ref 5–15)
BUN: 42 mg/dL — ABNORMAL HIGH (ref 8–23)
CO2: 34 mmol/L — ABNORMAL HIGH (ref 22–32)
Calcium: 8.5 mg/dL — ABNORMAL LOW (ref 8.9–10.3)
Chloride: 102 mmol/L (ref 98–111)
Creatinine, Ser: 0.63 mg/dL (ref 0.61–1.24)
GFR calc Af Amer: >60 mL/min (ref >60)
GFR calc non Af Amer: >60 mL/min (ref >60)
Glucose, Bld: 92 mg/dL (ref 70–99)
Potassium: 4.2 mmol/L (ref 3.5–5.1)
SODIUM: 145 mmol/L (ref 135–145)

## 2019-01-11 LAB — MAGNESIUM: Magnesium: 2.2 mg/dL (ref 1.7–2.4)

## 2019-01-11 LAB — PROTIME-INR
INR: 1.78
Prothrombin Time: 20.5 seconds — ABNORMAL HIGH (ref 11.4–15.2)

## 2019-01-11 NOTE — Progress Notes (Signed)
Pulmonary Critical Care Medicine [facility] [place]   PULMONARY CRITICAL CARE SERVICE  PROGRESS NOTE  Date of Service: 01/11/2019  Steven Wong  [id number]:[id number]  DOB: 22-Mar-1951   DOA: 12/15/2018  Referring Physician: Carron Curie, MD  HPI: Steven Wong is a 68 y.o. male seen for follow up of Acute on Chronic Respiratory Failure.  Patient is weaning the goal is for 24 hours.  The patient has actually had a swallowing study done also which he was able to pass right now is doing fine  Medications: Reviewed on Rounds  Physical Exam:  Vitals: Temperature 97.0 pulse 65 respiratory 26 blood pressure 114/63 saturations 98%  Ventilator Settings off the ventilator right now on T collar FiO2 is 28%  . General: Comfortable at this time . Eyes: Grossly normal lids, irises & conjunctiva . ENT: grossly tongue is normal . Neck: no obvious mass . Cardiovascular: S1 S2 normal no gallop . Respiratory: No rhonchi or rales are noted at this time . Abdomen: soft . Skin: no rash seen on limited exam . Musculoskeletal: not rigid . Psychiatric:unable to assess . Neurologic: no seizure no involuntary movements         Lab Data:   Basic Metabolic Panel: Recent Labs  Lab 01/06/19 0429 01/08/19 0605 01/11/19 0644  NA 140 139 145  K 4.3 4.1 4.2  CL 102 100 102  CO2 30 31 34*  GLUCOSE 103* 109* 92  BUN 63* 56* 42*  CREATININE 0.99 0.75 0.63  CALCIUM 7.9* 7.9* 8.5*  MG 2.6* 2.6* 2.2  PHOS 3.6 3.3  --     ABG: No results for input(s): PHART, PCO2ART, PO2ART, HCO3, O2SAT in the last 168 hours.  Liver Function Tests: No results for input(s): AST, ALT, ALKPHOS, BILITOT, PROT, ALBUMIN in the last 168 hours. No results for input(s): LIPASE, AMYLASE in the last 168 hours. No results for input(s): AMMONIA in the last 168 hours.  CBC: Recent Labs  Lab 01/06/19 0429 01/08/19 0605  WBC 10.4 7.9  HGB 8.0* 8.0*  HCT 26.6* 25.9*  MCV 99.3 100.4*  PLT 304 336    Cardiac  Enzymes: No results for input(s): CKTOTAL, CKMB, CKMBINDEX, TROPONINI in the last 168 hours.  BNP (last 3 results) No results for input(s): BNP in the last 8760 hours.  ProBNP (last 3 results) No results for input(s): PROBNP in the last 8760 hours.  Radiological Exams: No results found.  Assessment/Plan Active Problems:   Acute on chronic respiratory failure with hypoxia (HCC)   Severe sepsis (HCC)   Chronic atrial fibrillation   Chronic systolic heart failure (HCC)   Atelectasis of left lung   Alcohol abuse with unspecified alcohol-induced disorder (HCC)   Iatrogenic pneumothorax   Bilateral pulmonary embolism (HCC)   1. Acute on chronic respiratory failure with hypoxia we will continue with the wean on the T collar continue pulmonary toilet monitor for any aspiration. 2. Severe sepsis resolved 3. Chronic atrial fibrillation currently his rate is controlled 4. Chronic systolic heart failure compensated we will continue to monitor. 5. Atelectasis of the left lung continue with aggressive pulmonary toilet supportive care 6. Alcohol abuse at baseline continue to monitor 7. Pneumothorax resolved 8. Pulmonary embolisms resolved   I have personally seen and evaluated the patient, evaluated laboratory and imaging results, formulated the assessment and plan and placed orders. The Patient requires high complexity decision making for assessment and support.  Case was discussed on Rounds with the Respiratory Therapy Staff  Yevonne Pax,  MD [facility] Pulmonary Critical Care Medicine Sleep Medicine

## 2019-01-12 DIAGNOSIS — J9811 Atelectasis: Secondary | ICD-10-CM | POA: Diagnosis not present

## 2019-01-12 DIAGNOSIS — F1019 Alcohol abuse with unspecified alcohol-induced disorder: Secondary | ICD-10-CM | POA: Diagnosis not present

## 2019-01-12 DIAGNOSIS — I482 Chronic atrial fibrillation, unspecified: Secondary | ICD-10-CM | POA: Diagnosis not present

## 2019-01-12 DIAGNOSIS — J9621 Acute and chronic respiratory failure with hypoxia: Secondary | ICD-10-CM | POA: Diagnosis not present

## 2019-01-12 LAB — PROTIME-INR
INR: 2.26
Prothrombin Time: 24.7 seconds — ABNORMAL HIGH (ref 11.4–15.2)

## 2019-01-12 NOTE — Progress Notes (Signed)
Pulmonary Critical Care Medicine [facility] [place]   PULMONARY CRITICAL CARE SERVICE  PROGRESS NOTE  Date of Service: 01/12/2019  Steven Wong  OJJ:[id number]  DOB: 1951-01-28   DOA: 12/15/2018  Referring Physician: Carron Curie, MD  HPI: Steven Wong is a 68 y.o. male seen for follow up of Acute on Chronic Respiratory Failure.  He is on T collar 28% FiO2.  He is also been eating respiratory reported to me that he has been having some thick secretions however.  Patient has been requiring Mucomyst and suctioning for the secretions.  Medications: Reviewed on Rounds  Physical Exam:  Vitals: Temperature is 97.1 pulse 71 respiratory rate 17 blood pressure 107/58 saturations 97%  Ventilator Settings currently off the ventilator on T collar FiO2 28%  . General: Comfortable at this time . Eyes: Grossly normal lids, irises & conjunctiva . ENT: grossly tongue is normal . Neck: no obvious mass . Cardiovascular: S1 S2 normal no gallop . Respiratory: No rhonchi or rales are noted at this time . Abdomen: soft . Skin: no rash seen on limited exam . Musculoskeletal: not rigid . Psychiatric:unable to assess . Neurologic: no seizure no involuntary movements         Lab Data:   Basic Metabolic Panel: Recent Labs  Lab 01/06/19 0429 01/08/19 0605 01/11/19 0644  NA 140 139 145  K 4.3 4.1 4.2  CL 102 100 102  CO2 30 31 34*  GLUCOSE 103* 109* 92  BUN 63* 56* 42*  CREATININE 0.99 0.75 0.63  CALCIUM 7.9* 7.9* 8.5*  MG 2.6* 2.6* 2.2  PHOS 3.6 3.3  --     ABG: No results for input(s): PHART, PCO2ART, PO2ART, HCO3, O2SAT in the last 168 hours.  Liver Function Tests: No results for input(s): AST, ALT, ALKPHOS, BILITOT, PROT, ALBUMIN in the last 168 hours. No results for input(s): LIPASE, AMYLASE in the last 168 hours. No results for input(s): AMMONIA in the last 168 hours.  CBC: Recent Labs  Lab 01/06/19 0429 01/08/19 0605  WBC 10.4 7.9  HGB 8.0* 8.0*  HCT  26.6* 25.9*  MCV 99.3 100.4*  PLT 304 336    Cardiac Enzymes: No results for input(s): CKTOTAL, CKMB, CKMBINDEX, TROPONINI in the last 168 hours.  BNP (last 3 results) No results for input(s): BNP in the last 8760 hours.  ProBNP (last 3 results) No results for input(s): PROBNP in the last 8760 hours.  Radiological Exams: No results found.  Assessment/Plan Active Problems:   Acute on chronic respiratory failure with hypoxia (HCC)   Severe sepsis (HCC)   Chronic atrial fibrillation   Chronic systolic heart failure (HCC)   Atelectasis of left lung   Alcohol abuse with unspecified alcohol-induced disorder (HCC)   Iatrogenic pneumothorax   Bilateral pulmonary embolism (HCC)   1. Acute on chronic respiratory failure with hypoxia we will continue with the T collar spoke to respiratory therapy about possibly trying him on capping trials however if the secretions are excessive then I would not to the capping they will reassess 2. Severe sepsis resolved 3. Chronic atrial fibrillation rate controlled at this time 4. Chronic systolic heart failure at baseline 5. Atelectasis needs ongoing pulmonary toilet continue with aggressive pulmonary toilet 6. Iatrogenic pneumothorax resolved 7. Bilateral pulmonary emboli treated resolved 8. Alcohol abuse no active withdrawal is noted at this time   I have personally seen and evaluated the patient, evaluated laboratory and imaging results, formulated the assessment and plan and placed orders. The Patient requires  high complexity decision making for assessment and support.  Case was discussed on Rounds with the Respiratory Therapy Staff  Allyne Gee, MD [facility] Pulmonary Critical Care Medicine Sleep Medicine

## 2019-01-13 DIAGNOSIS — J9811 Atelectasis: Secondary | ICD-10-CM | POA: Diagnosis not present

## 2019-01-13 DIAGNOSIS — F1019 Alcohol abuse with unspecified alcohol-induced disorder: Secondary | ICD-10-CM | POA: Diagnosis not present

## 2019-01-13 DIAGNOSIS — I482 Chronic atrial fibrillation, unspecified: Secondary | ICD-10-CM | POA: Diagnosis not present

## 2019-01-13 DIAGNOSIS — J9621 Acute and chronic respiratory failure with hypoxia: Secondary | ICD-10-CM | POA: Diagnosis not present

## 2019-01-13 LAB — PROTIME-INR
INR: 3.25
Prothrombin Time: 32.7 seconds — ABNORMAL HIGH (ref 11.4–15.2)

## 2019-01-13 NOTE — Progress Notes (Signed)
Pulmonary Critical Care Medicine [facility] [place]   PULMONARY CRITICAL CARE SERVICE  PROGRESS NOTE  Date of Service: 01/13/2019  Steven Wong  VZC:[id number]  DOB: 08-25-1951   DOA: 12/15/2018  Referring Physician: Carron Curie, MD  HPI: Steven Wong is a 68 y.o. male seen for follow up of Acute on Chronic Respiratory Failure.  He is doing well with the T collar has been tolerating PMV also but still has copious secretions.  He has been off the ventilator for 48 hours  Medications: Reviewed on Rounds  Physical Exam:  Vitals: Temperature 98.0 pulse 50 respiratory 24 blood pressure is 145/74 saturations 99%  Ventilator Settings mode of ventilation is off the ventilator on T collar FiO2 28% with PMV  . General: Comfortable at this time . Eyes: Grossly normal lids, irises & conjunctiva . ENT: grossly tongue is normal . Neck: no obvious mass . Cardiovascular: S1 S2 normal no gallop . Respiratory: No rhonchi or rales are noted at this time . Abdomen: soft . Skin: no rash seen on limited exam . Musculoskeletal: not rigid . Psychiatric:unable to assess . Neurologic: no seizure no involuntary movements         Lab Data:   Basic Metabolic Panel: Recent Labs  Lab 01/08/19 0605 01/11/19 0644  NA 139 145  K 4.1 4.2  CL 100 102  CO2 31 34*  GLUCOSE 109* 92  BUN 56* 42*  CREATININE 0.75 0.63  CALCIUM 7.9* 8.5*  MG 2.6* 2.2  PHOS 3.3  --     ABG: No results for input(s): PHART, PCO2ART, PO2ART, HCO3, O2SAT in the last 168 hours.  Liver Function Tests: No results for input(s): AST, ALT, ALKPHOS, BILITOT, PROT, ALBUMIN in the last 168 hours. No results for input(s): LIPASE, AMYLASE in the last 168 hours. No results for input(s): AMMONIA in the last 168 hours.  CBC: Recent Labs  Lab 01/08/19 0605  WBC 7.9  HGB 8.0*  HCT 25.9*  MCV 100.4*  PLT 336    Cardiac Enzymes: No results for input(s): CKTOTAL, CKMB, CKMBINDEX, TROPONINI in the last 168  hours.  BNP (last 3 results) No results for input(s): BNP in the last 8760 hours.  ProBNP (last 3 results) No results for input(s): PROBNP in the last 8760 hours.  Radiological Exams: No results found.  Assessment/Plan Active Problems:   Acute on chronic respiratory failure with hypoxia (HCC)   Severe sepsis (HCC)   Chronic atrial fibrillation   Chronic systolic heart failure (HCC)   Atelectasis of left lung   Alcohol abuse with unspecified alcohol-induced disorder (HCC)   Iatrogenic pneumothorax   Bilateral pulmonary embolism (HCC)   1. Acute on chronic respiratory failure with hypoxia we will continue with T collar trials continue pulmonary toilet supportive care goal today is 72 hours 2. Severe sepsis resolved 3. Chronic atrial fibrillation rate controlled 4. Chronic systolic heart failure compensated 5. Atelectasis continue aggressive pulmonary toilet 6. Pneumothorax resolved 7. Bilateral pulmonary embolism resolved 8. Alcohol abuse no active withdrawal   I have personally seen and evaluated the patient, evaluated laboratory and imaging results, formulated the assessment and plan and placed orders. The Patient requires high complexity decision making for assessment and support.  Case was discussed on Rounds with the Respiratory Therapy Staff  Yevonne Pax, MD [facility] Pulmonary Critical Care Medicine Sleep Medicine

## 2019-01-14 DIAGNOSIS — F1019 Alcohol abuse with unspecified alcohol-induced disorder: Secondary | ICD-10-CM | POA: Diagnosis not present

## 2019-01-14 DIAGNOSIS — I482 Chronic atrial fibrillation, unspecified: Secondary | ICD-10-CM | POA: Diagnosis not present

## 2019-01-14 DIAGNOSIS — J9621 Acute and chronic respiratory failure with hypoxia: Secondary | ICD-10-CM | POA: Diagnosis not present

## 2019-01-14 DIAGNOSIS — J9811 Atelectasis: Secondary | ICD-10-CM | POA: Diagnosis not present

## 2019-01-14 LAB — CBC
HCT: 27.6 % — ABNORMAL LOW (ref 39.0–52.0)
Hemoglobin: 8.3 g/dL — ABNORMAL LOW (ref 13.0–17.0)
MCH: 30.4 pg (ref 26.0–34.0)
MCHC: 30.1 g/dL (ref 30.0–36.0)
MCV: 101.1 fL — AB (ref 80.0–100.0)
NRBC: 0 % (ref 0.0–0.2)
PLATELETS: 324 10*3/uL (ref 150–400)
RBC: 2.73 MIL/uL — ABNORMAL LOW (ref 4.22–5.81)
RDW: 18.6 % — ABNORMAL HIGH (ref 11.5–15.5)
WBC: 8 10*3/uL (ref 4.0–10.5)

## 2019-01-14 LAB — PROTIME-INR
INR: 2.46
Prothrombin Time: 26.4 seconds — ABNORMAL HIGH (ref 11.4–15.2)

## 2019-01-14 LAB — BASIC METABOLIC PANEL
ANION GAP: 10 (ref 5–15)
BUN: 17 mg/dL (ref 8–23)
CO2: 33 mmol/L — ABNORMAL HIGH (ref 22–32)
Calcium: 8.4 mg/dL — ABNORMAL LOW (ref 8.9–10.3)
Chloride: 99 mmol/L (ref 98–111)
Creatinine, Ser: 0.61 mg/dL (ref 0.61–1.24)
GFR calc Af Amer: >60 mL/min (ref >60)
GFR calc non Af Amer: >60 mL/min (ref >60)
Glucose, Bld: 91 mg/dL (ref 70–99)
Potassium: 4.1 mmol/L (ref 3.5–5.1)
Sodium: 142 mmol/L (ref 135–145)

## 2019-01-14 LAB — MAGNESIUM: Magnesium: 1.8 mg/dL (ref 1.7–2.4)

## 2019-01-14 NOTE — Progress Notes (Signed)
Pulmonary Critical Care Medicine [facility] [place]   PULMONARY CRITICAL CARE SERVICE  PROGRESS NOTE  Date of Service: 01/14/2019  Steven Wong  CNO:[id number]  DOB: 03-13-1951   DOA: 12/15/2018  Referring Physician: Carron Curie, MD  HPI: Steven Wong is a 68 y.o. male seen for follow up of Acute on Chronic Respiratory Failure.  Patient is currently on T collar and is using the PMV.  He still has very copious secretions however  Medications: Reviewed on Rounds  Physical Exam:  Vitals: Temperature 96.8 pulse 92 respiratory 34 blood pressure 106/54 saturations 99%  Ventilator Settings currently on T collar FiO2 28% with the PMV in place  . General: Comfortable at this time . Eyes: Grossly normal lids, irises & conjunctiva . ENT: grossly tongue is normal . Neck: no obvious mass . Cardiovascular: S1 S2 normal no gallop . Respiratory: No rhonchi or rales are noted at this time . Abdomen: soft . Skin: no rash seen on limited exam . Musculoskeletal: not rigid . Psychiatric:unable to assess . Neurologic: no seizure no involuntary movements         Lab Data:   Basic Metabolic Panel: Recent Labs  Lab 01/08/19 0605 01/11/19 0644 01/14/19 0555  NA 139 145 142  K 4.1 4.2 4.1  CL 100 102 99  CO2 31 34* 33*  GLUCOSE 109* 92 91  BUN 56* 42* 17  CREATININE 0.75 0.63 0.61  CALCIUM 7.9* 8.5* 8.4*  MG 2.6* 2.2 1.8  PHOS 3.3  --   --     ABG: No results for input(s): PHART, PCO2ART, PO2ART, HCO3, O2SAT in the last 168 hours.  Liver Function Tests: No results for input(s): AST, ALT, ALKPHOS, BILITOT, PROT, ALBUMIN in the last 168 hours. No results for input(s): LIPASE, AMYLASE in the last 168 hours. No results for input(s): AMMONIA in the last 168 hours.  CBC: Recent Labs  Lab 01/08/19 0605 01/14/19 0555  WBC 7.9 8.0  HGB 8.0* 8.3*  HCT 25.9* 27.6*  MCV 100.4* 101.1*  PLT 336 324    Cardiac Enzymes: No results for input(s): CKTOTAL, CKMB,  CKMBINDEX, TROPONINI in the last 168 hours.  BNP (last 3 results) No results for input(s): BNP in the last 8760 hours.  ProBNP (last 3 results) No results for input(s): PROBNP in the last 8760 hours.  Radiological Exams: No results found.  Assessment/Plan Active Problems:   Acute on chronic respiratory failure with hypoxia (HCC)   Severe sepsis (HCC)   Chronic atrial fibrillation   Chronic systolic heart failure (HCC)   Atelectasis of left lung   Alcohol abuse with unspecified alcohol-induced disorder (HCC)   Iatrogenic pneumothorax   Bilateral pulmonary embolism (HCC)   1. Acute on chronic respiratory failure with hypoxia patient will continue with pulmonary T collar titrate oxygen as tolerated continue pulmonary toilet supportive care. 2. Severe sepsis hemodynamically stable at this time we will continue present management 3. Chronic atrial fibrillation rate controlled at this time 4. Chronic systolic heart failure at baseline we will continue to follow 5. Atelectasis aggressive pulmonary toilet 6. Alcohol abuse no active withdrawal 7. Bilateral pulmonary embolism treated improved   I have personally seen and evaluated the patient, evaluated laboratory and imaging results, formulated the assessment and plan and placed orders. The Patient requires high complexity decision making for assessment and support.  Case was discussed on Rounds with the Respiratory Therapy Staff  Yevonne Pax, MD [facility] Pulmonary Critical Care Medicine Sleep Medicine

## 2019-01-15 DIAGNOSIS — I482 Chronic atrial fibrillation, unspecified: Secondary | ICD-10-CM | POA: Diagnosis not present

## 2019-01-15 DIAGNOSIS — F1019 Alcohol abuse with unspecified alcohol-induced disorder: Secondary | ICD-10-CM | POA: Diagnosis not present

## 2019-01-15 DIAGNOSIS — J9621 Acute and chronic respiratory failure with hypoxia: Secondary | ICD-10-CM | POA: Diagnosis not present

## 2019-01-15 DIAGNOSIS — J9811 Atelectasis: Secondary | ICD-10-CM | POA: Diagnosis not present

## 2019-01-15 LAB — PROTIME-INR
INR: 2.06
Prothrombin Time: 22.9 seconds — ABNORMAL HIGH (ref 11.4–15.2)

## 2019-01-15 NOTE — Progress Notes (Signed)
Pulmonary Critical Care Medicine [facility] [place]   PULMONARY CRITICAL CARE SERVICE  PROGRESS NOTE  Date of Service: 01/15/2019  Steven Wong  MPN:[id number]  DOB: November 01, 1951   DOA: 12/15/2018  Referring Physician: Carron Curie, MD  HPI: Steven Wong is a 68 y.o. male seen for follow up of Acute on Chronic Respiratory Failure.  Patient right now is on T collar has been on 28% FiO2 off the ventilator now for more than 72 hours secretions are still copious I do not anticipate decannulation  Medications: Reviewed on Rounds  Physical Exam:  Vitals: Temperature 97.5 pulse 74 respiratory 23 blood pressure 100/52 saturations 96%  Ventilator Settings on T collar FiO2 28%  . General: Comfortable at this time . Eyes: Grossly normal lids, irises & conjunctiva . ENT: grossly tongue is normal . Neck: no obvious mass . Cardiovascular: S1 S2 normal no gallop . Respiratory: No rhonchi or rales are noted at this time . Abdomen: soft . Skin: no rash seen on limited exam . Musculoskeletal: not rigid . Psychiatric:unable to assess . Neurologic: no seizure no involuntary movements         Lab Data:   Basic Metabolic Panel: Recent Labs  Lab 01/11/19 0644 01/14/19 0555  NA 145 142  K 4.2 4.1  CL 102 99  CO2 34* 33*  GLUCOSE 92 91  BUN 42* 17  CREATININE 0.63 0.61  CALCIUM 8.5* 8.4*  MG 2.2 1.8    ABG: No results for input(s): PHART, PCO2ART, PO2ART, HCO3, O2SAT in the last 168 hours.  Liver Function Tests: No results for input(s): AST, ALT, ALKPHOS, BILITOT, PROT, ALBUMIN in the last 168 hours. No results for input(s): LIPASE, AMYLASE in the last 168 hours. No results for input(s): AMMONIA in the last 168 hours.  CBC: Recent Labs  Lab 01/14/19 0555  WBC 8.0  HGB 8.3*  HCT 27.6*  MCV 101.1*  PLT 324    Cardiac Enzymes: No results for input(s): CKTOTAL, CKMB, CKMBINDEX, TROPONINI in the last 168 hours.  BNP (last 3 results) No results for input(s):  BNP in the last 8760 hours.  ProBNP (last 3 results) No results for input(s): PROBNP in the last 8760 hours.  Radiological Exams: No results found.  Assessment/Plan Active Problems:   Acute on chronic respiratory failure with hypoxia (HCC)   Severe sepsis (HCC)   Chronic atrial fibrillation   Chronic systolic heart failure (HCC)   Atelectasis of left lung   Alcohol abuse with unspecified alcohol-induced disorder (HCC)   Iatrogenic pneumothorax   Bilateral pulmonary embolism (HCC)   1. Acute on chronic respiratory failure with hypoxia we will continue with T collar wean as tolerated no decannulation is planned for the short-term 2. Severe sepsis hemodynamically stable 3. Chronic atrial fibrillation rate controlled 4. Chronic systolic heart failure at baseline 5. Atelectasis we will continue with supportive care aggressive pulmonary toilet 6. Alcohol abuse no withdrawal 7. Bilateral pulmonary emboli treated   I have personally seen and evaluated the patient, evaluated laboratory and imaging results, formulated the assessment and plan and placed orders. The Patient requires high complexity decision making for assessment and support.  Case was discussed on Rounds with the Respiratory Therapy Staff  Yevonne Pax, MD [facility] Pulmonary Critical Care Medicine Sleep Medicine

## 2019-01-16 ENCOUNTER — Other Ambulatory Visit (HOSPITAL_COMMUNITY): Payer: Self-pay

## 2019-01-16 DIAGNOSIS — J9621 Acute and chronic respiratory failure with hypoxia: Secondary | ICD-10-CM | POA: Diagnosis not present

## 2019-01-16 DIAGNOSIS — J9811 Atelectasis: Secondary | ICD-10-CM | POA: Diagnosis not present

## 2019-01-16 DIAGNOSIS — F1019 Alcohol abuse with unspecified alcohol-induced disorder: Secondary | ICD-10-CM | POA: Diagnosis not present

## 2019-01-16 DIAGNOSIS — I482 Chronic atrial fibrillation, unspecified: Secondary | ICD-10-CM | POA: Diagnosis not present

## 2019-01-16 LAB — PROTIME-INR
INR: 1.8
INR: 1.86
Prothrombin Time: 20.7 seconds — ABNORMAL HIGH (ref 11.4–15.2)
Prothrombin Time: 21.2 seconds — ABNORMAL HIGH (ref 11.4–15.2)

## 2019-01-16 LAB — CBC
HCT: 27.2 % — ABNORMAL LOW (ref 39.0–52.0)
Hemoglobin: 8.6 g/dL — ABNORMAL LOW (ref 13.0–17.0)
MCH: 31.7 pg (ref 26.0–34.0)
MCHC: 31.6 g/dL (ref 30.0–36.0)
MCV: 100.4 fL — AB (ref 80.0–100.0)
Platelets: 318 10*3/uL (ref 150–400)
RBC: 2.71 MIL/uL — ABNORMAL LOW (ref 4.22–5.81)
RDW: 18.1 % — ABNORMAL HIGH (ref 11.5–15.5)
WBC: 8 10*3/uL (ref 4.0–10.5)
nRBC: 0 % (ref 0.0–0.2)

## 2019-01-16 LAB — BASIC METABOLIC PANEL
Anion gap: 9 (ref 5–15)
BUN: 15 mg/dL (ref 8–23)
CO2: 34 mmol/L — ABNORMAL HIGH (ref 22–32)
CREATININE: 0.62 mg/dL (ref 0.61–1.24)
Calcium: 8.1 mg/dL — ABNORMAL LOW (ref 8.9–10.3)
Chloride: 96 mmol/L — ABNORMAL LOW (ref 98–111)
GFR calc Af Amer: >60 mL/min (ref >60)
GFR calc non Af Amer: >60 mL/min (ref >60)
GLUCOSE: 81 mg/dL (ref 70–99)
Potassium: 3.5 mmol/L (ref 3.5–5.1)
Sodium: 139 mmol/L (ref 135–145)

## 2019-01-16 LAB — LACTIC ACID, PLASMA: Lactic Acid, Venous: 0.7 mmol/L (ref 0.5–1.9)

## 2019-01-16 LAB — MAGNESIUM: Magnesium: 1.6 mg/dL — ABNORMAL LOW (ref 1.7–2.4)

## 2019-01-16 NOTE — Progress Notes (Signed)
Pulmonary Critical Care Medicine [facility] [place]   PULMONARY CRITICAL CARE SERVICE  PROGRESS NOTE  Date of Service: 01/16/2019  Steven Wong  [id number]:[id number]  DOB: 23-Jun-1951   DOA: 12/15/2018  Referring Physician: Carron Curie, MD  HPI: Steven Wong is a 68 y.o. male seen for follow up of Acute on Chronic Respiratory Failure.  Patient at this time is on T collar.  Was noted to have some increase in back pressure still has a cuffed trach in place spoke with respiratory therapy to change him over to a cuffless trach and try to downsize him see if this helps  Medications: Reviewed on Rounds  Physical Exam:  Vitals: Temperature 96.5 pulse 74 respiratory 19 blood pressure 88/53 saturations 100%  Ventilator Settings off the ventilator right now on T collar  . General: Comfortable at this time . Eyes: Grossly normal lids, irises & conjunctiva . ENT: grossly tongue is normal . Neck: no obvious mass . Cardiovascular: S1 S2 normal no gallop . Respiratory: No rhonchi or rales are noted . Abdomen: soft . Skin: no rash seen on limited exam . Musculoskeletal: not rigid . Psychiatric:unable to assess . Neurologic: no seizure no involuntary movements         Lab Data:   Basic Metabolic Panel: Recent Labs  Lab 01/11/19 0644 01/14/19 0555 01/16/19 0608  NA 145 142 139  K 4.2 4.1 3.5  CL 102 99 96*  CO2 34* 33* 34*  GLUCOSE 92 91 81  BUN 42* 17 15  CREATININE 0.63 0.61 0.62  CALCIUM 8.5* 8.4* 8.1*  MG 2.2 1.8 1.6*    ABG: No results for input(s): PHART, PCO2ART, PO2ART, HCO3, O2SAT in the last 168 hours.  Liver Function Tests: No results for input(s): AST, ALT, ALKPHOS, BILITOT, PROT, ALBUMIN in the last 168 hours. No results for input(s): LIPASE, AMYLASE in the last 168 hours. No results for input(s): AMMONIA in the last 168 hours.  CBC: Recent Labs  Lab 01/14/19 0555 01/16/19 0608  WBC 8.0 8.0  HGB 8.3* 8.6*  HCT 27.6* 27.2*  MCV 101.1* 100.4*   PLT 324 318    Cardiac Enzymes: No results for input(s): CKTOTAL, CKMB, CKMBINDEX, TROPONINI in the last 168 hours.  BNP (last 3 results) No results for input(s): BNP in the last 8760 hours.  ProBNP (last 3 results) No results for input(s): PROBNP in the last 8760 hours.  Radiological Exams: No results found.  Assessment/Plan Active Problems:   Acute on chronic respiratory failure with hypoxia (HCC)   Severe sepsis (HCC)   Chronic atrial fibrillation   Chronic systolic heart failure (HCC)   Atelectasis of left lung   Alcohol abuse with unspecified alcohol-induced disorder (HCC)   Iatrogenic pneumothorax   Bilateral pulmonary embolism (HCC)   1. Acute on chronic respiratory failure with hypoxia patient is doing better slowly improving secretions are still copious continue with aggressive pulmonary toilet. 2. Chronic atrial fibrillation rate is controlled 3. Chronic systolic heart failure at baseline 4. Atelectasis continue with aggressive pulmonary toilet 5. Pulmonary embolism resolved 6. Alcohol abuse no active withdrawal noted at this time   I have personally seen and evaluated the patient, evaluated laboratory and imaging results, formulated the assessment and plan and placed orders. The Patient requires high complexity decision making for assessment and support.  Case was discussed on Rounds with the Respiratory Therapy Staff  Yevonne Pax, MD [facility] Pulmonary Critical Care Medicine Sleep Medicine

## 2019-01-17 DIAGNOSIS — F1019 Alcohol abuse with unspecified alcohol-induced disorder: Secondary | ICD-10-CM | POA: Diagnosis not present

## 2019-01-17 DIAGNOSIS — J9811 Atelectasis: Secondary | ICD-10-CM | POA: Diagnosis not present

## 2019-01-17 DIAGNOSIS — J9621 Acute and chronic respiratory failure with hypoxia: Secondary | ICD-10-CM | POA: Diagnosis not present

## 2019-01-17 DIAGNOSIS — I482 Chronic atrial fibrillation, unspecified: Secondary | ICD-10-CM | POA: Diagnosis not present

## 2019-01-17 LAB — BASIC METABOLIC PANEL
ANION GAP: 6 (ref 5–15)
BUN: 16 mg/dL (ref 8–23)
CO2: 33 mmol/L — ABNORMAL HIGH (ref 22–32)
Calcium: 7.9 mg/dL — ABNORMAL LOW (ref 8.9–10.3)
Chloride: 98 mmol/L (ref 98–111)
Creatinine, Ser: 0.63 mg/dL (ref 0.61–1.24)
GFR calc Af Amer: >60 mL/min (ref >60)
GFR calc non Af Amer: >60 mL/min (ref >60)
Glucose, Bld: 79 mg/dL (ref 70–99)
Potassium: 3.5 mmol/L (ref 3.5–5.1)
SODIUM: 137 mmol/L (ref 135–145)

## 2019-01-17 LAB — PROTIME-INR
INR: 2.25
PROTHROMBIN TIME: 24.5 s — AB (ref 11.4–15.2)

## 2019-01-17 LAB — MAGNESIUM: Magnesium: 1.7 mg/dL (ref 1.7–2.4)

## 2019-01-17 NOTE — Progress Notes (Signed)
Pulmonary Critical Care Medicine [facility] [place]   PULMONARY CRITICAL CARE SERVICE  PROGRESS NOTE  Date of Service: 01/17/2019  Steven Wong  HQP:[id number]  DOB: 15-Aug-1951   DOA: 12/15/2018  Referring Physician: Carron Curie, MD  HPI: Steven Wong is a 68 y.o. male seen for follow up of Acute on Chronic Respiratory Failure.  Patient is on T collar at this time on 28% FiO2.  Secretions are fair to moderate  Medications: Reviewed on Rounds  Physical Exam:  Vitals: Temperature 97.3 pulse 81 respiratory 18 blood pressure 96/52 saturations 99%  Ventilator Settings off ventilator on T collar  . General: Comfortable at this time . Eyes: Grossly normal lids, irises & conjunctiva . ENT: grossly tongue is normal . Neck: no obvious mass . Cardiovascular: S1 S2 normal no gallop . Respiratory: No rhonchi noted at this time . Abdomen: soft . Skin: no rash seen on limited exam . Musculoskeletal: not rigid . Psychiatric:unable to assess . Neurologic: no seizure no involuntary movements         Lab Data:   Basic Metabolic Panel: Recent Labs  Lab 01/11/19 0644 01/14/19 0555 01/16/19 0608 01/17/19 0701  NA 145 142 139 137  K 4.2 4.1 3.5 3.5  CL 102 99 96* 98  CO2 34* 33* 34* 33*  GLUCOSE 92 91 81 79  BUN 42* 17 15 16   CREATININE 0.63 0.61 0.62 0.63  CALCIUM 8.5* 8.4* 8.1* 7.9*  MG 2.2 1.8 1.6* 1.7    ABG: No results for input(s): PHART, PCO2ART, PO2ART, HCO3, O2SAT in the last 168 hours.  Liver Function Tests: No results for input(s): AST, ALT, ALKPHOS, BILITOT, PROT, ALBUMIN in the last 168 hours. No results for input(s): LIPASE, AMYLASE in the last 168 hours. No results for input(s): AMMONIA in the last 168 hours.  CBC: Recent Labs  Lab 01/14/19 0555 01/16/19 0608  WBC 8.0 8.0  HGB 8.3* 8.6*  HCT 27.6* 27.2*  MCV 101.1* 100.4*  PLT 324 318    Cardiac Enzymes: No results for input(s): CKTOTAL, CKMB, CKMBINDEX, TROPONINI in the last 168  hours.  BNP (last 3 results) No results for input(s): BNP in the last 8760 hours.  ProBNP (last 3 results) No results for input(s): PROBNP in the last 8760 hours.  Radiological Exams: Dg Chest Port 1 View  Result Date: 01/16/2019 CLINICAL DATA:  Pleural effusion. EXAM: PORTABLE CHEST 1 VIEW COMPARISON:  01/07/2019.  01/03/2019. FINDINGS: NG tube is been removed. Left PICC line stable position. Tracheostomy tube in good anatomic position. Heart size normal. Bibasilar atelectasis again noted. Slight improvement in aeration from prior exam. Small left pleural effusion again noted. No pneumothorax. No acute bony abnormality. IMPRESSION: 1. Interim removal of NG tube and left PICC line. Tracheostomy tube stable position. 2. Persistent bibasilar atelectasis again noted. Slight improvement in aeration from prior exam. Small left pleural effusion again noted without significant interim change. Electronically Signed   By: Maisie Fus  Register   On: 01/16/2019 13:37    Assessment/Plan Active Problems:   Acute on chronic respiratory failure with hypoxia (HCC)   Severe sepsis (HCC)   Chronic atrial fibrillation   Chronic systolic heart failure (HCC)   Atelectasis of left lung   Alcohol abuse with unspecified alcohol-induced disorder (HCC)   Iatrogenic pneumothorax   Bilateral pulmonary embolism (HCC)   1. Acute on chronic respiratory failure hypoxia we will continue with T collar patient is at his baseline.  Continue secretion management pulmonary toilet. 2. Severe sepsis resolved  hemodynamically stable 3. Chronic atrial fibrillation rate control 4. Chronic systolic heart failure improving on the chest x-ray. 5. Atelectasis showing some improvement 6. Pneumothorax resolved 7. Bilateral pulmonary embolism treated   I have personally seen and evaluated the patient, evaluated laboratory and imaging results, formulated the assessment and plan and placed orders. The Patient requires high complexity  decision making for assessment and support.  Case was discussed on Rounds with the Respiratory Therapy Staff  Yevonne Pax, MD [facility] Pulmonary Critical Care Medicine Sleep Medicine

## 2019-01-18 DIAGNOSIS — F1019 Alcohol abuse with unspecified alcohol-induced disorder: Secondary | ICD-10-CM | POA: Diagnosis not present

## 2019-01-18 DIAGNOSIS — J9621 Acute and chronic respiratory failure with hypoxia: Secondary | ICD-10-CM | POA: Diagnosis not present

## 2019-01-18 DIAGNOSIS — J9811 Atelectasis: Secondary | ICD-10-CM | POA: Diagnosis not present

## 2019-01-18 DIAGNOSIS — I482 Chronic atrial fibrillation, unspecified: Secondary | ICD-10-CM | POA: Diagnosis not present

## 2019-01-18 LAB — CBC
HCT: 26.6 % — ABNORMAL LOW (ref 39.0–52.0)
Hemoglobin: 8.4 g/dL — ABNORMAL LOW (ref 13.0–17.0)
MCH: 31.3 pg (ref 26.0–34.0)
MCHC: 31.6 g/dL (ref 30.0–36.0)
MCV: 99.3 fL (ref 80.0–100.0)
Platelets: 329 10*3/uL (ref 150–400)
RBC: 2.68 MIL/uL — ABNORMAL LOW (ref 4.22–5.81)
RDW: 17.7 % — ABNORMAL HIGH (ref 11.5–15.5)
WBC: 9.3 10*3/uL (ref 4.0–10.5)
nRBC: 0 % (ref 0.0–0.2)

## 2019-01-18 LAB — BASIC METABOLIC PANEL
Anion gap: 10 (ref 5–15)
BUN: 17 mg/dL (ref 8–23)
CO2: 32 mmol/L (ref 22–32)
Calcium: 8.2 mg/dL — ABNORMAL LOW (ref 8.9–10.3)
Chloride: 96 mmol/L — ABNORMAL LOW (ref 98–111)
Creatinine, Ser: 0.75 mg/dL (ref 0.61–1.24)
GFR calc Af Amer: >60 mL/min (ref >60)
GFR calc non Af Amer: >60 mL/min (ref >60)
Glucose, Bld: 76 mg/dL (ref 70–99)
Potassium: 4.3 mmol/L (ref 3.5–5.1)
Sodium: 138 mmol/L (ref 135–145)

## 2019-01-18 LAB — MAGNESIUM: Magnesium: 2 mg/dL (ref 1.7–2.4)

## 2019-01-18 LAB — PROTIME-INR
INR: 2.58
Prothrombin Time: 27.3 seconds — ABNORMAL HIGH (ref 11.4–15.2)

## 2019-01-18 NOTE — Progress Notes (Addendum)
Pulmonary Critical Care Medicine [facility] [place]   PULMONARY CRITICAL CARE SERVICE  PROGRESS NOTE  Date of Service: 01/18/2019  Steven Wong  OIB:[id number]  DOB: 1951/07/14   DOA: 12/15/2018  Referring Physician: Carron Curie, MD  HPI: Steven Wong is a 68 y.o. male seen for follow up of Acute on Chronic Respiratory Failure.  Patient continues to do well on trach collar 28%.  He is tolerating PMV.  Patient has moderate secretions and is at his baseline. Medications: Reviewed on Rounds  Physical Exam:  Vitals: Pulse 68 respirations 22 blood pressure 101/63 O2 sat 98% temp 98.4  Ventilator Settings patient is not currently on ventilator  . General: Comfortable at this time . Eyes: Grossly normal lids, irises & conjunctiva . ENT: grossly tongue is normal . Neck: no obvious mass . Cardiovascular: S1 S2 normal no gallop . Respiratory: No rales or rhonchi noted . Abdomen: soft . Skin: no rash seen on limited exam . Musculoskeletal: not rigid . Psychiatric:unable to assess . Neurologic: no seizure no involuntary movements         Lab Data:   Basic Metabolic Panel: Recent Labs  Lab 01/14/19 0555 01/16/19 0608 01/17/19 0701 01/18/19 0637  NA 142 139 137 138  K 4.1 3.5 3.5 4.3  CL 99 96* 98 96*  CO2 33* 34* 33* 32  GLUCOSE 91 81 79 76  BUN 17 15 16 17   CREATININE 0.61 0.62 9.45 0.75  CALCIUM 8.4* 8.1* 7.9* 8.2*  MG 1.8 1.6* 1.7 2.0    ABG: No results for input(s): PHART, PCO2ART, PO2ART, HCO3, O2SAT in the last 168 hours.  Liver Function Tests: No results for input(s): AST, ALT, ALKPHOS, BILITOT, PROT, ALBUMIN in the last 168 hours. No results for input(s): LIPASE, AMYLASE in the last 168 hours. No results for input(s): AMMONIA in the last 168 hours.  CBC: Recent Labs  Lab 01/14/19 0555 01/16/19 0608 01/18/19 0637  WBC 8.0 8.0 9.3  HGB 8.3* 8.6* 8.4*  HCT 27.6* 27.2* 26.6*  MCV 101.1* 100.4* 99.3  PLT 324 318 329    Cardiac  Enzymes: No results for input(s): CKTOTAL, CKMB, CKMBINDEX, TROPONINI in the last 168 hours.  BNP (last 3 results) No results for input(s): BNP in the last 8760 hours.  ProBNP (last 3 results) No results for input(s): PROBNP in the last 8760 hours.  Radiological Exams: No results found.  Assessment/Plan Active Problems:   Acute on chronic respiratory failure with hypoxia (HCC)   Severe sepsis (HCC)   Chronic atrial fibrillation   Chronic systolic heart failure (HCC)   Atelectasis of left lung   Alcohol abuse with unspecified alcohol-induced disorder (HCC)   Iatrogenic pneumothorax   Bilateral pulmonary embolism (HCC)   1. Acute on chronic respiratory failure with hypoxia continue trach collar as patient is at baseline.  Continue secretion management pulmonary toilet. 2. Severe sepsis resolved hemodynamically stable 3. Chronic atrial fibrillation rate controlled 4. Chronic systolic heart failure improving on chest x-ray 5. Atelectasis showing some improvement 6. Pneumothorax resolved 7. Bilateral pulmonary embolism treated   I have personally seen and evaluated the patient, evaluated laboratory and imaging results, formulated the assessment and plan and placed orders. The Patient requires high complexity decision making for assessment and support.  Case was discussed on Rounds with the Respiratory Therapy Staff  Yevonne Pax, MD [facility] Pulmonary Critical Care Medicine Sleep Medicine

## 2019-01-19 DIAGNOSIS — J9811 Atelectasis: Secondary | ICD-10-CM | POA: Diagnosis not present

## 2019-01-19 DIAGNOSIS — J9621 Acute and chronic respiratory failure with hypoxia: Secondary | ICD-10-CM | POA: Diagnosis not present

## 2019-01-19 DIAGNOSIS — F1019 Alcohol abuse with unspecified alcohol-induced disorder: Secondary | ICD-10-CM | POA: Diagnosis not present

## 2019-01-19 DIAGNOSIS — I482 Chronic atrial fibrillation, unspecified: Secondary | ICD-10-CM | POA: Diagnosis not present

## 2019-01-19 LAB — PROTIME-INR
INR: 2.99
Prothrombin Time: 30.6 seconds — ABNORMAL HIGH (ref 11.4–15.2)

## 2019-01-19 NOTE — Progress Notes (Signed)
Pulmonary Critical Care Medicine [facility] [place]   PULMONARY CRITICAL CARE SERVICE  PROGRESS NOTE  Date of Service: 01/19/2019  Steven Wong  QPR:[id number]  DOB: September 04, 1951   DOA: 12/15/2018  Referring Physician: Carron Curie, MD  HPI: Steven Wong is a 68 y.o. male seen for follow up of Acute on Chronic Respiratory Failure.  He is comfortable right now on T collar no distress is noted currently is on 28% FiO2 with excellent saturations using the PMV  Medications: Reviewed on Rounds  Physical Exam:  Vitals: Temperature 97.0 pulse 76 respiratory 18 blood pressure 97/45 saturations 96%  Ventilator Settings off the ventilator on T collar  . General: Comfortable at this time . Eyes: Grossly normal lids, irises & conjunctiva . ENT: grossly tongue is normal . Neck: no obvious mass . Cardiovascular: S1 S2 normal no gallop . Respiratory: No rhonchi or rales are noted . Abdomen: soft . Skin: no rash seen on limited exam . Musculoskeletal: not rigid . Psychiatric:unable to assess . Neurologic: no seizure no involuntary movements         Lab Data:   Basic Metabolic Panel: Recent Labs  Lab 01/14/19 0555 01/16/19 0608 01/17/19 0701 01/18/19 0637  NA 142 139 137 138  K 4.1 3.5 3.5 4.3  CL 99 96* 98 96*  CO2 33* 34* 33* 32  GLUCOSE 91 81 79 76  BUN 17 15 16 17   CREATININE 0.61 0.62 0.63 0.75  CALCIUM 8.4* 8.1* 7.9* 8.2*  MG 1.8 1.6* 1.7 2.0    ABG: No results for input(s): PHART, PCO2ART, PO2ART, HCO3, O2SAT in the last 168 hours.  Liver Function Tests: No results for input(s): AST, ALT, ALKPHOS, BILITOT, PROT, ALBUMIN in the last 168 hours. No results for input(s): LIPASE, AMYLASE in the last 168 hours. No results for input(s): AMMONIA in the last 168 hours.  CBC: Recent Labs  Lab 01/14/19 0555 01/16/19 0608 01/18/19 0637  WBC 8.0 8.0 9.3  HGB 8.3* 8.6* 8.4*  HCT 27.6* 27.2* 26.6*  MCV 101.1* 100.4* 99.3  PLT 324 318 329    Cardiac  Enzymes: No results for input(s): CKTOTAL, CKMB, CKMBINDEX, TROPONINI in the last 168 hours.  BNP (last 3 results) No results for input(s): BNP in the last 8760 hours.  ProBNP (last 3 results) No results for input(s): PROBNP in the last 8760 hours.  Radiological Exams: No results found.  Assessment/Plan Active Problems:   Acute on chronic respiratory failure with hypoxia (HCC)   Severe sepsis (HCC)   Chronic atrial fibrillation   Chronic systolic heart failure (HCC)   Atelectasis of left lung   Alcohol abuse with unspecified alcohol-induced disorder (HCC)   Iatrogenic pneumothorax   Bilateral pulmonary embolism (HCC)   1. Acute on chronic respiratory failure hypoxia we will continue with T collar and PMV as ordered 2. Continue secretion management pulmonary toilet 3. Severe sepsis resolved 4. Chronic atrial fibrillation rate controlled 5. Chronic systolic heart failure compensated 6. Atelectasis improved 7. Pneumothorax resolved 8. Pulmonary embolism treated   I have personally seen and evaluated the patient, evaluated laboratory and imaging results, formulated the assessment and plan and placed orders. The Patient requires high complexity decision making for assessment and support.  Case was discussed on Rounds with the Respiratory Therapy Staff  Yevonne Pax, MD [facility] Pulmonary Critical Care Medicine Sleep Medicine

## 2019-01-24 LAB — FUNGUS CULTURE WITH STAIN

## 2019-01-24 LAB — FUNGAL ORGANISM REFLEX

## 2019-01-24 LAB — FUNGUS CULTURE RESULT

## 2019-02-19 LAB — ACID FAST CULTURE WITH REFLEXED SENSITIVITIES (MYCOBACTERIA): Acid Fast Culture: NEGATIVE

## 2019-03-28 DEATH — deceased

## 2019-11-27 IMAGING — CT CT PERC PLEURAL DRAIN W/INDWELL CATH W/IMG GUIDE
1 of 2 series · 15 of 32 positions shown, 19 images · non-contrast
Comparison: none

INDICATION: Pneumothorax

[Series 2: i-spiral 5.0 b40f · axial · 0.71mm/px · z∈[+978,+1192]mm · 15 of 67 slices shown, 19 images]
[im 3/67  soft-tissue]
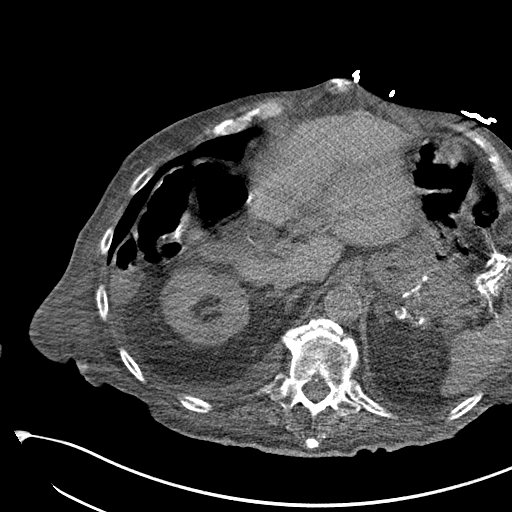
[im 3/67  bone]
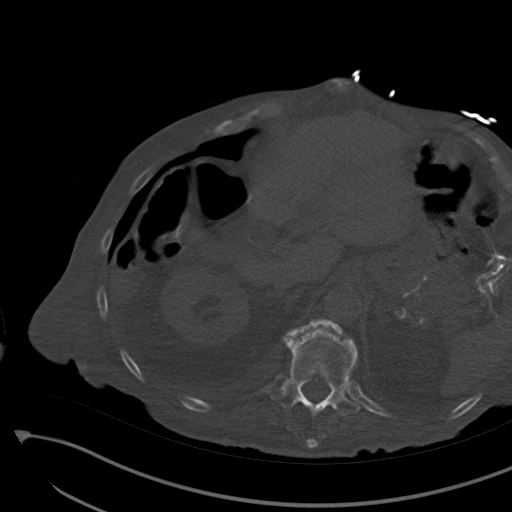
[im 8/67  soft-tissue]
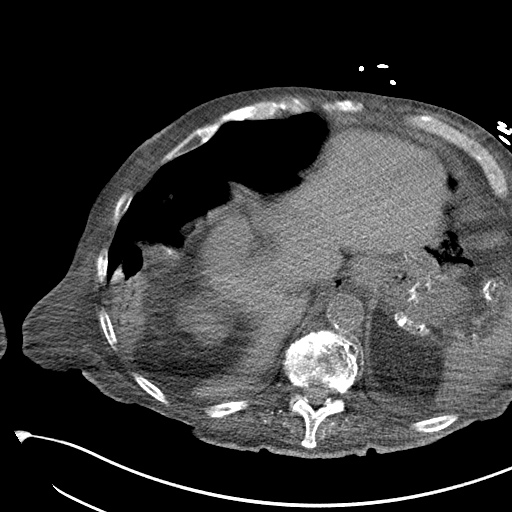
[im 13/67  soft-tissue]
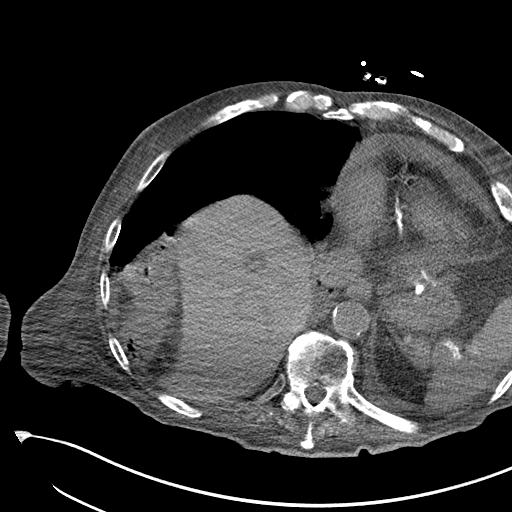
[im 18/67  soft-tissue]
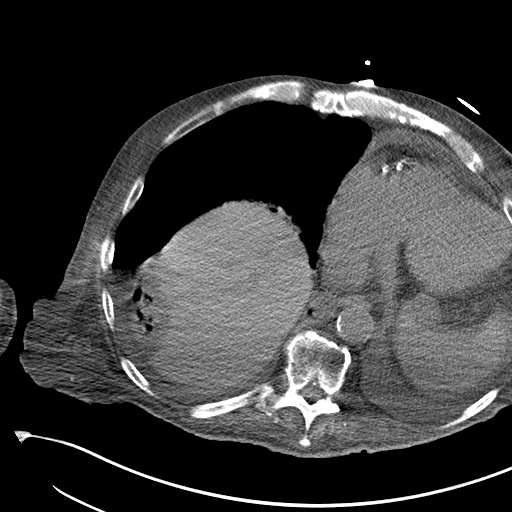
[im 23/67  soft-tissue]
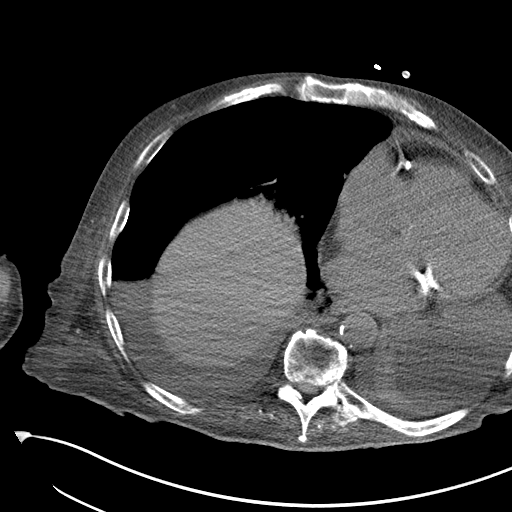
[im 28/67  soft-tissue]
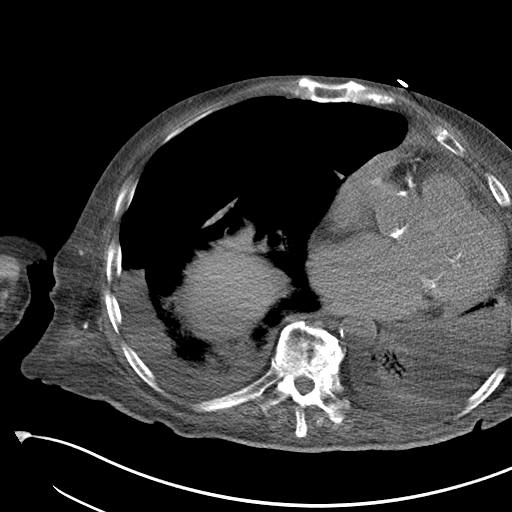
[im 34/67  soft-tissue]
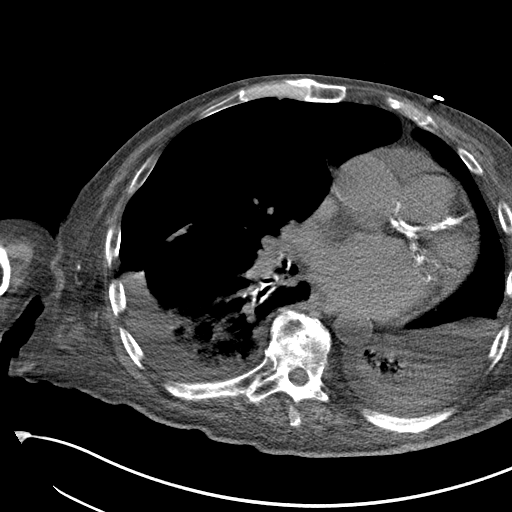
[im 39/67  soft-tissue]
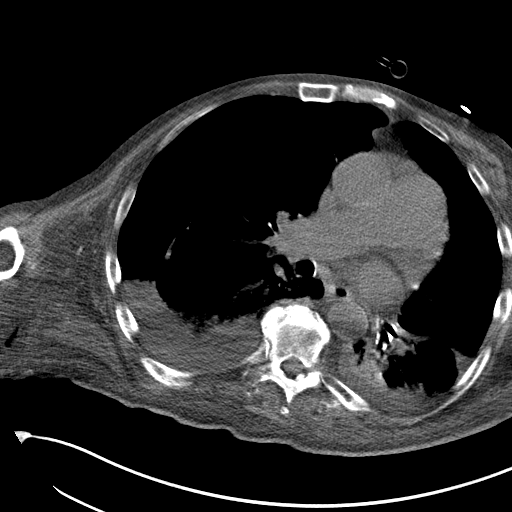
[im 44/67  soft-tissue]
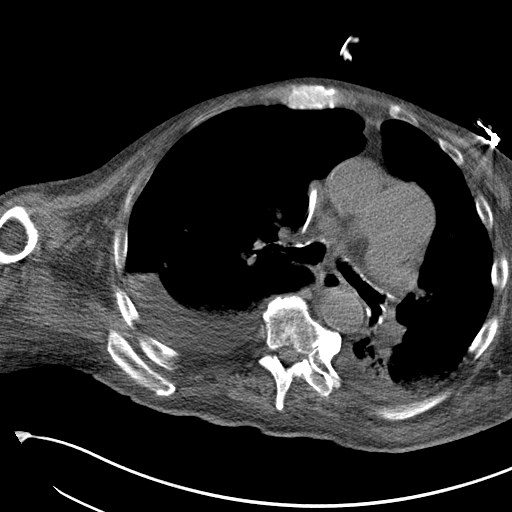
[im 44/67  bone]
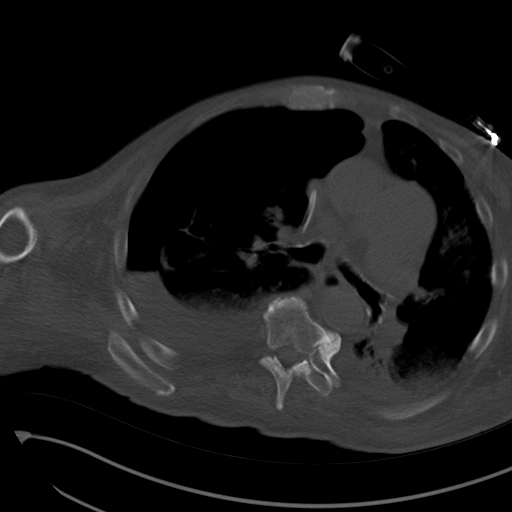
[im 49/67  soft-tissue]
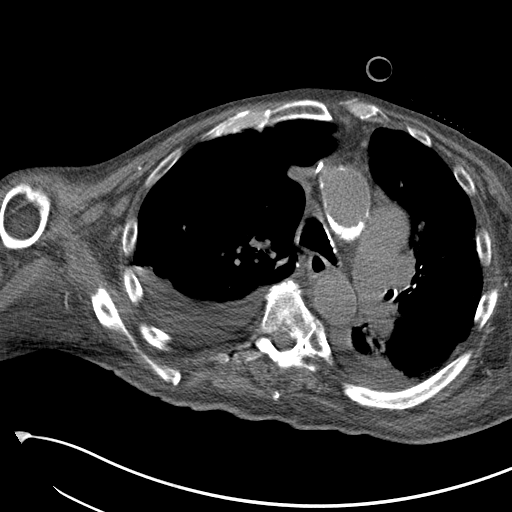
[im 54/67  soft-tissue]
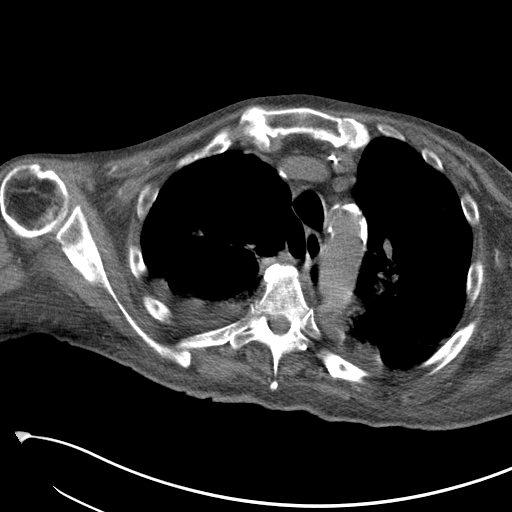
[im 56/67  lung]
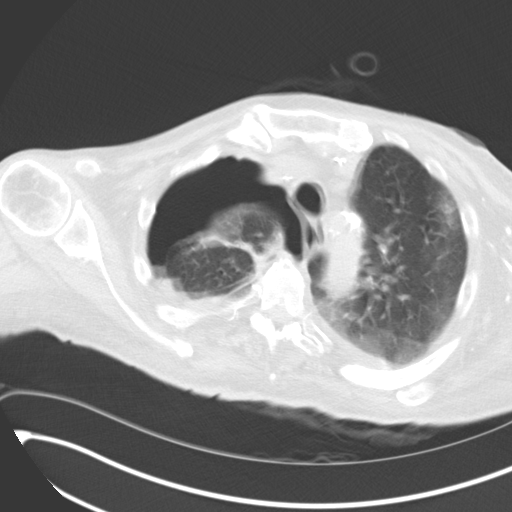
[im 59/67  soft-tissue]
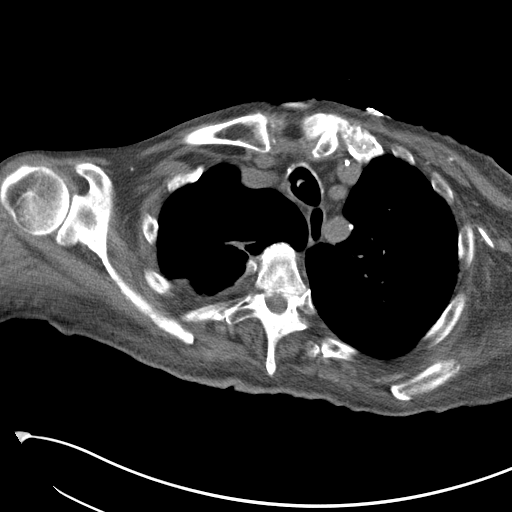
[im 59/67  lung]
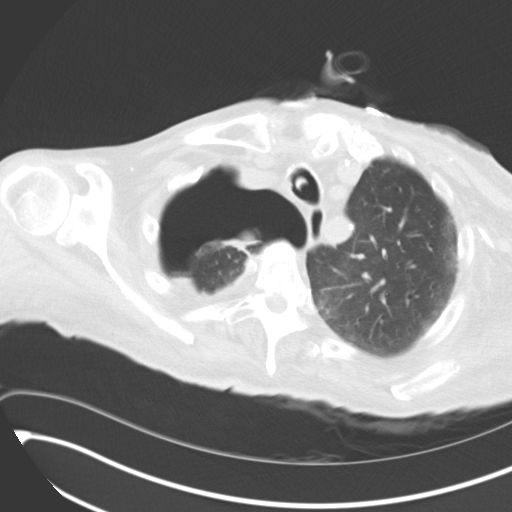
[im 61/67  lung]
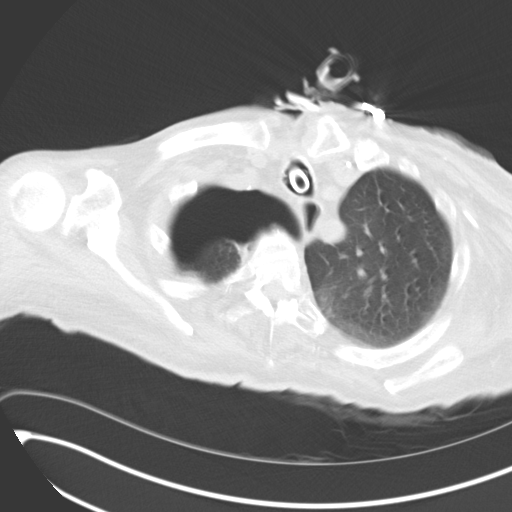
[im 64/67  soft-tissue]
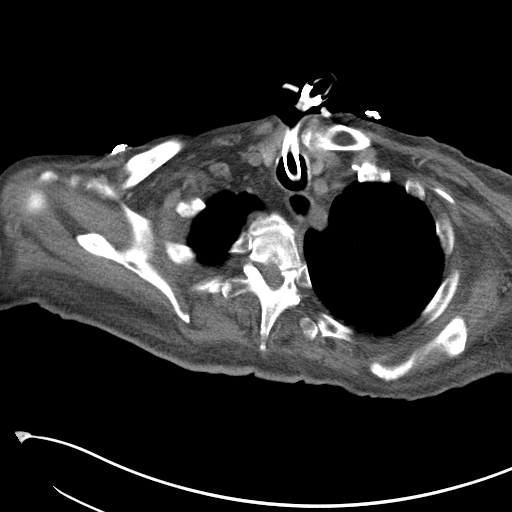
[im 64/67  lung]
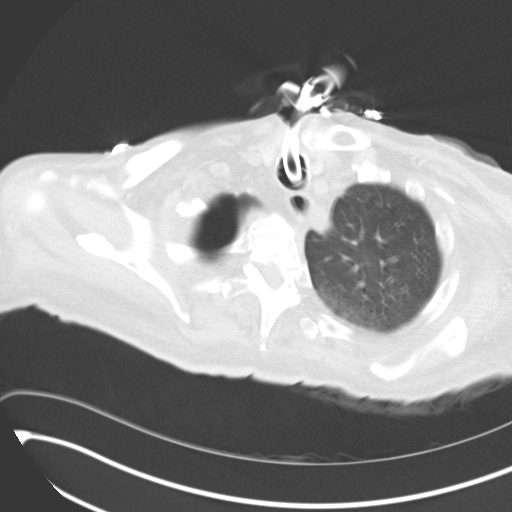

[15 of 32 positions shown; findings below may reference images not displayed]

EXAM:
RIGHT THORACOSTOMY

MEDICATIONS:
The patient is currently admitted to the hospital and receiving
intravenous antibiotics. The antibiotics were administered within an
appropriate time frame prior to the initiation of the procedure.

ANESTHESIA/SEDATION:
Fentanyl 75 mcg IV; Versed 1.5 mg IV

Moderate Sedation Time:  13 minutes

The patient was continuously monitored during the procedure by the
interventional radiology nurse under my direct supervision.

COMPLICATIONS:
None immediate.

PROCEDURE:
Informed written consent was obtained from the patient after a
thorough discussion of the procedural risks, benefits and
alternatives. All questions were addressed. Maximal Sterile Barrier
Technique was utilized including caps, mask, sterile gowns, sterile
gloves, sterile drape, hand hygiene and skin antiseptic. A timeout
was performed prior to the initiation of the procedure.

The right anterior chest was prepped and draped in a sterile
fashion. 1% lidocaine was utilized for local anesthesia. Under CT
guidance, an 18 gauge needle was advanced into the pleural space via
mid axillary line first intracostal space approach. It was removed
over an Amplatz wire. Twelve French dilator followed by a 12 French
drain were inserted. It was looped and string fixed then sewn to the
skin. It was attached to 20 cm water suction.
FINDINGS: Imaging confirms placement of a 12 French thoracostomy at the right
lung apex.
IMPRESSION: Successful right 12 French chest tube placement for pneumothorax.

## 2019-11-29 IMAGING — DX DG ABD PORTABLE 1V
1 series · 1 of 1 positions shown · non-contrast
Comparison: 12/28/2018

CLINICAL DATA: Follow-up ileus

EXAM:
PORTABLE ABDOMEN - 1 VIEW

[abdomen]
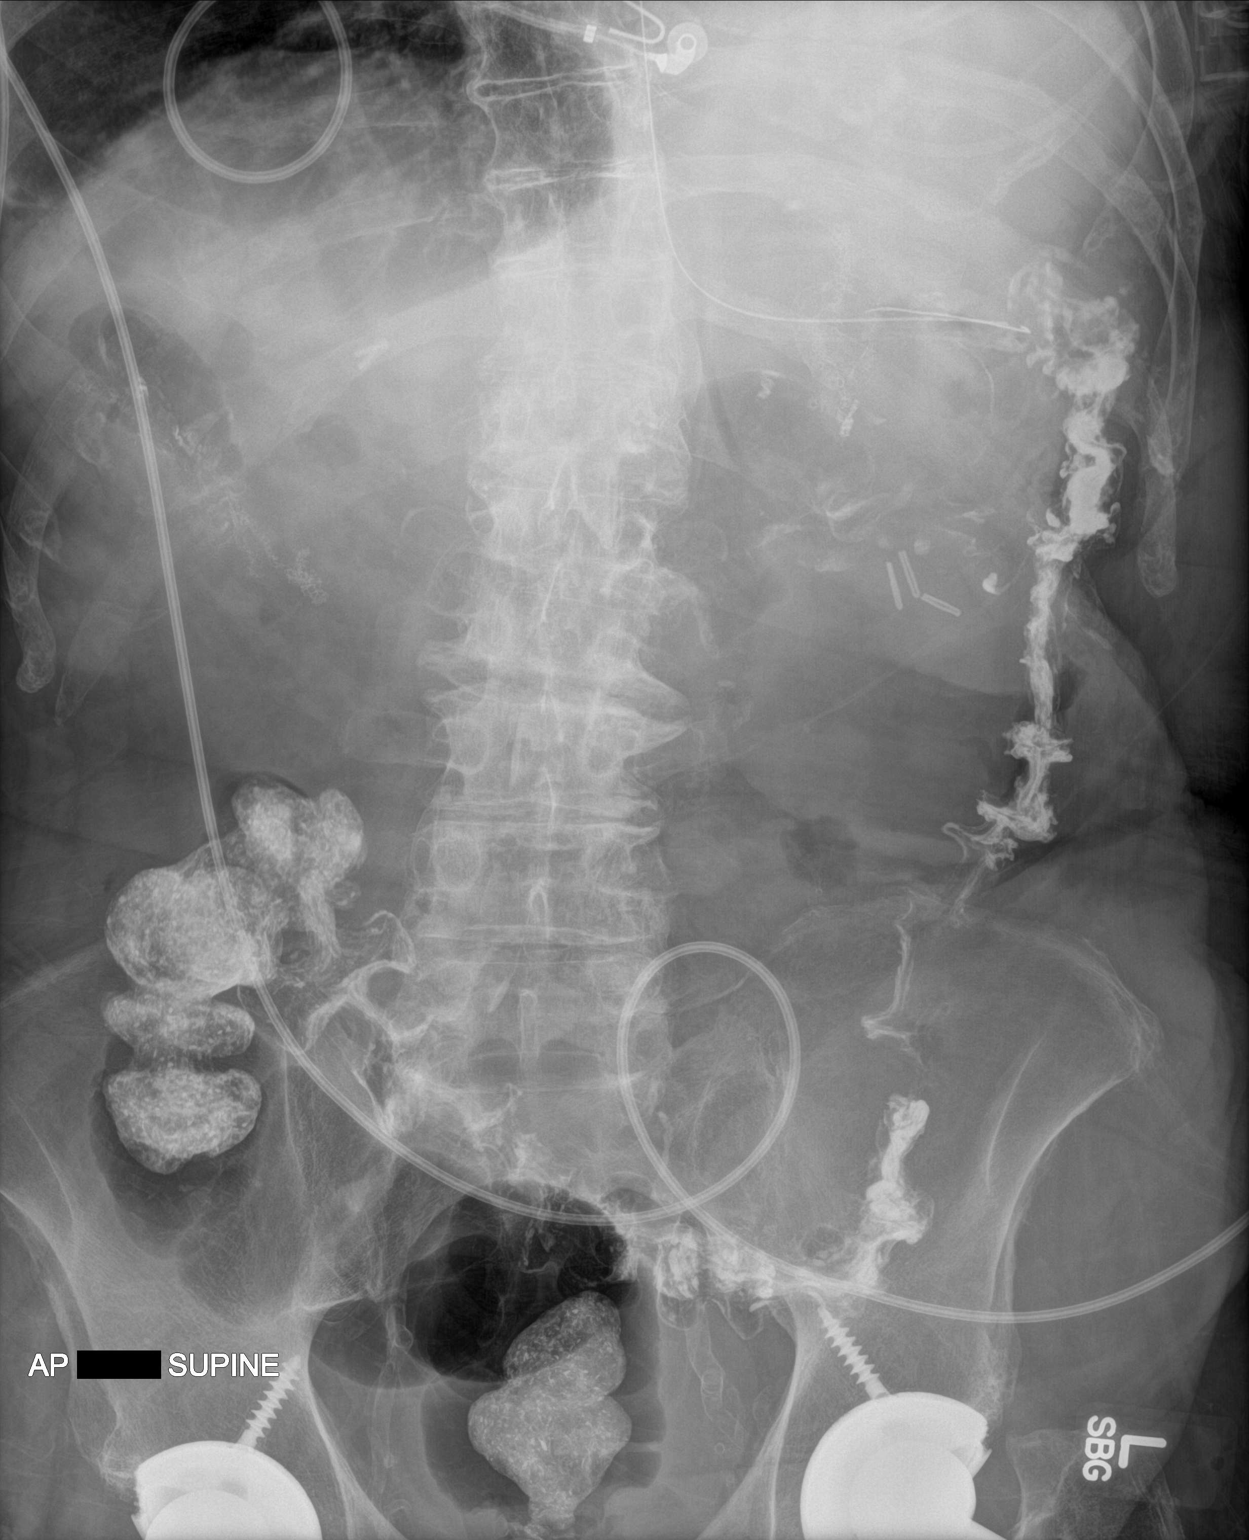

[1 of 1 positions shown; findings below may reference images not displayed]

FINDINGS: Nasogastric tube remains in the fundus of the stomach. Previously
administered contrast remains within the colon. No dilated small
bowel is seen.
IMPRESSION: Contrast remains within the colon. No dilated small bowel is seen.

## 2019-11-29 IMAGING — DX DG CHEST 1V PORT
1 series · 1 of 1 positions shown · non-contrast
Comparison: Earlier same day.

CLINICAL DATA: Respiratory distress from possible chest tube
dislodgement.

EXAM:
PORTABLE CHEST 1 VIEW

[chest ap]
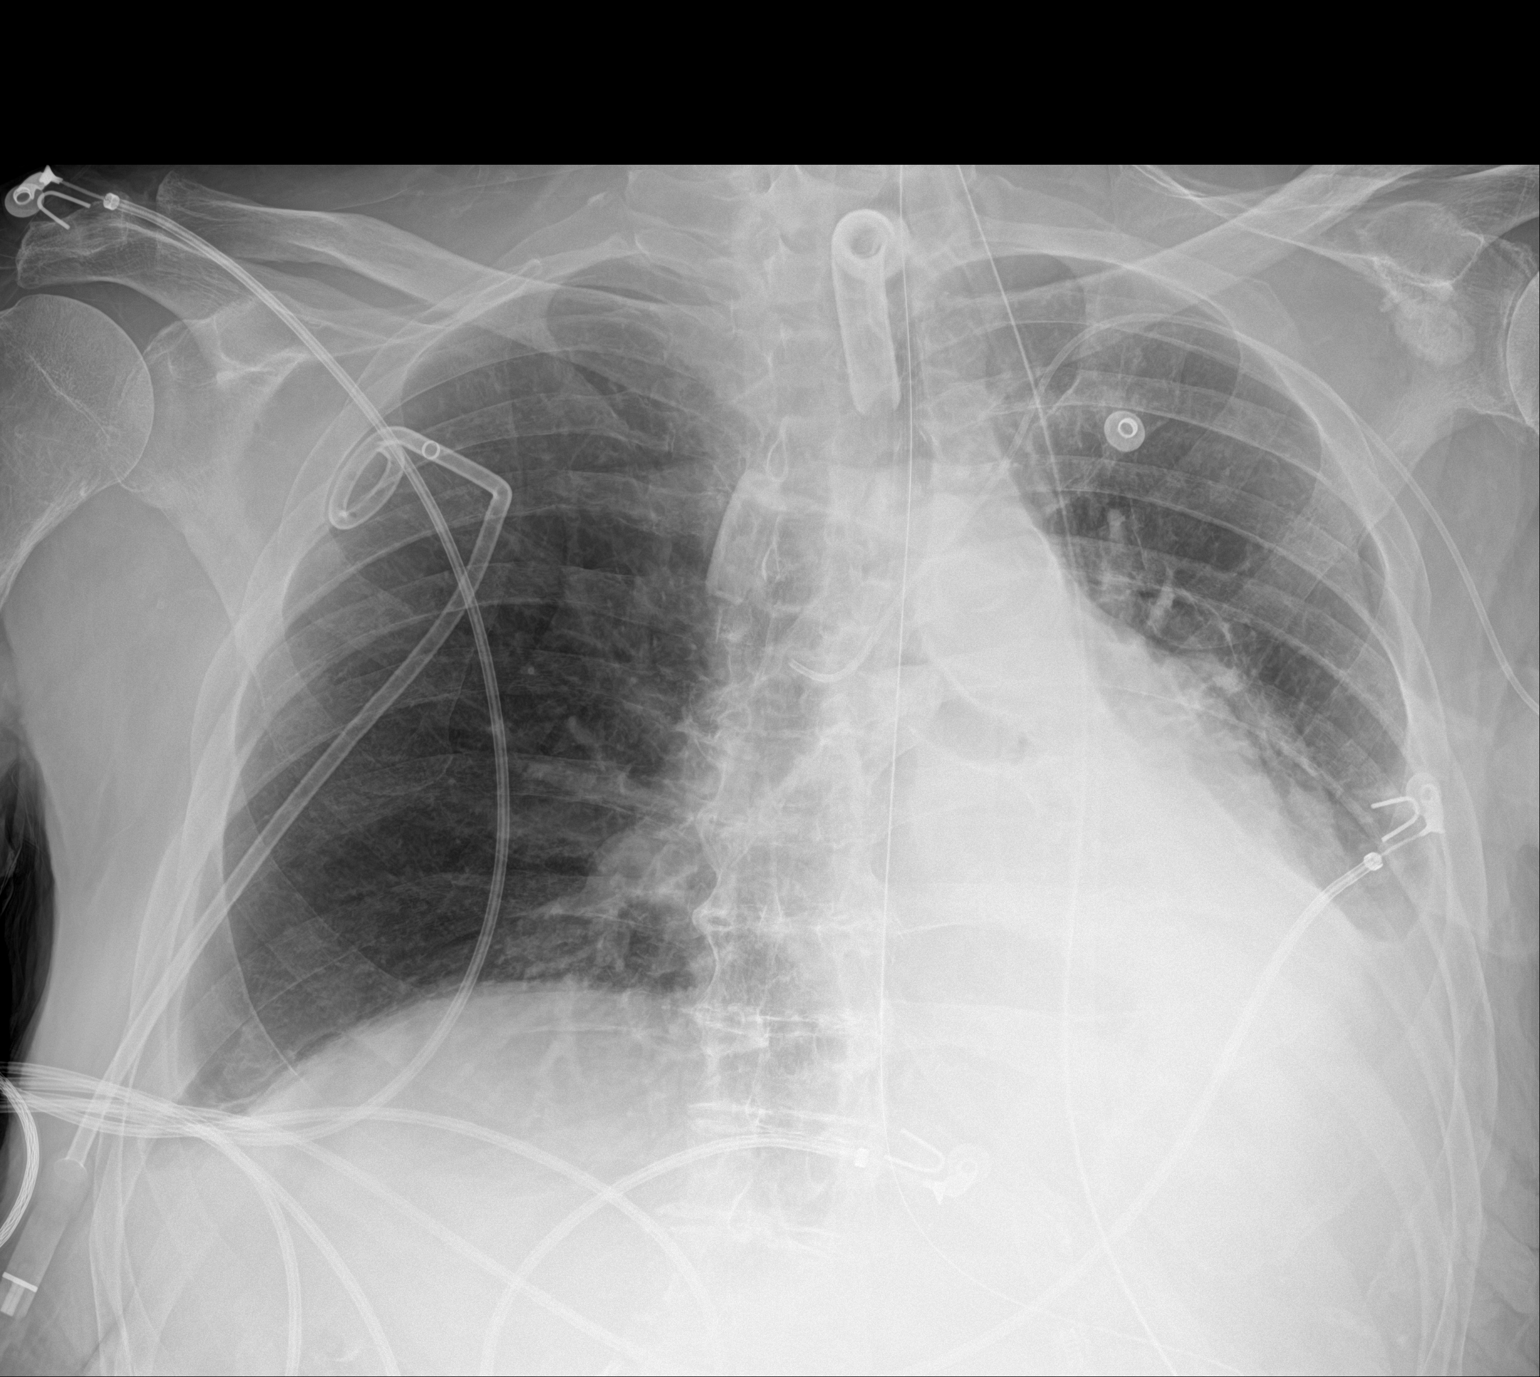

[1 of 1 positions shown; findings below may reference images not displayed]

FINDINGS: Tracheostomy tube in adequate position. Nasogastric tube courses off
the inferior aspect of the film as tip is not visualized. Left-sided
PICC line unchanged with tip over the region of the azygos vein.
Right pigtail pleural drainage catheter unchanged.

Lungs are adequately inflated demonstrate improvement in a moderate
size left effusion likely with associated basilar atelectasis. Small
amount right pleural fluid unchanged. No evidence of right
pneumothorax. Cardiomediastinal silhouette and remainder of the exam
is unchanged.
IMPRESSION: Interval improvement in moderate size left effusion likely
associated basilar atelectasis. Small amount right pleural fluid.

Right-sided pigtail pleural drainage catheter unchanged. No
pneumothorax.

Remaining tubes and lines unchanged.

## 2019-12-03 IMAGING — DX DG CHEST 1V PORT
1 series · 1 of 1 positions shown · non-contrast
Comparison: December 31, 2018

CLINICAL DATA: Chest tube placement for pneumothorax

EXAM:
PORTABLE CHEST 1 VIEW

[chest]
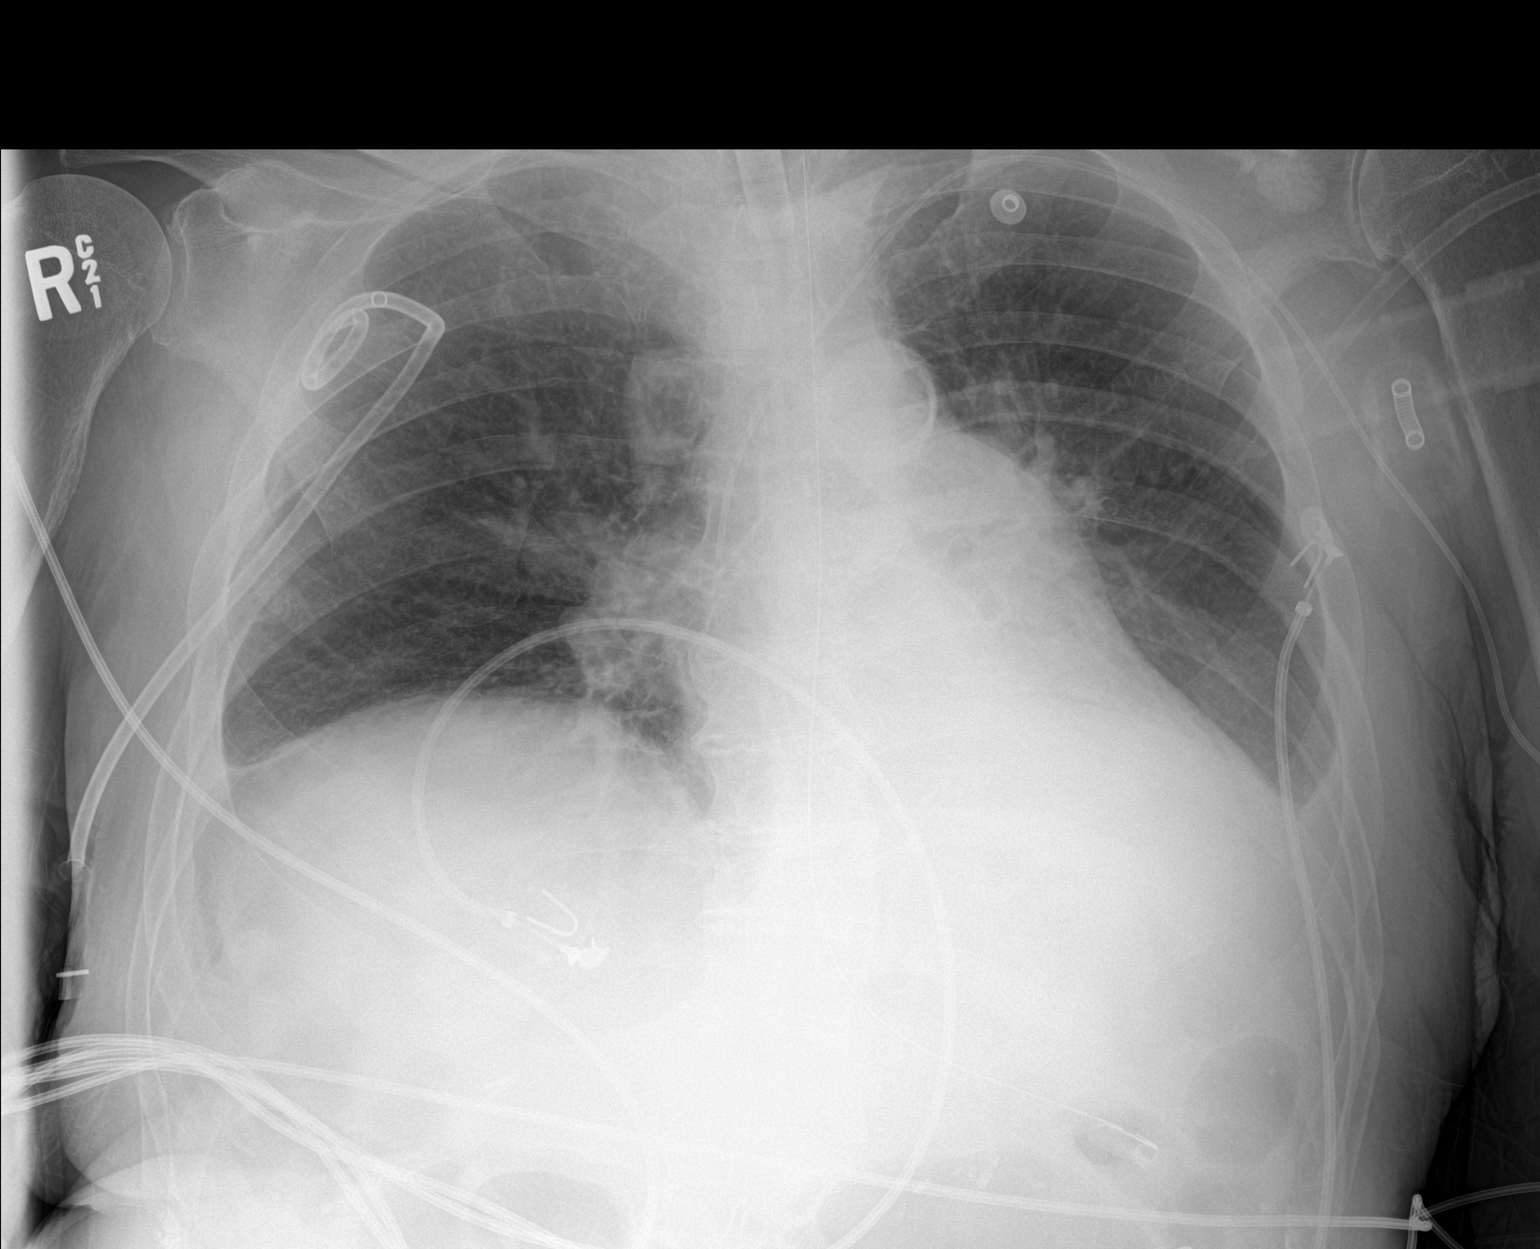

[1 of 1 positions shown; findings below may reference images not displayed]

FINDINGS: Tracheostomy catheter tip is 6.1 cm above the carina. Chest tube is
present on the right. Nasogastric tube tip and side port in stomach.
Central catheter tip is in the superior vena cava. No pneumothorax.
There is consolidation in the left lower lobe with small pleural
effusions bilaterally. Heart is upper normal in size with pulmonary
vascularity normal. There is aortic atherosclerosis. No adenopathy.
There is a suspected bone island in the lateral left scapula,
stable.
IMPRESSION: Tube and catheter positions as described. No evident pneumothorax.
Airspace consolidation consistent with pneumonia left lower lobe.
Small pleural effusions bilaterally. Stable cardiac silhouette.
There is aortic atherosclerosis.

Aortic Atherosclerosis ([id number]).
